# Patient Record
Sex: Female | Born: 1955
Health system: Southern US, Community
[De-identification: ages and names within clinical notes are randomized; demographics above are authoritative.]

## PROBLEM LIST (undated history)

## (undated) DIAGNOSIS — T7840XA Allergy, unspecified, initial encounter: Secondary | ICD-10-CM

## (undated) DIAGNOSIS — B019 Varicella without complication: Secondary | ICD-10-CM

## (undated) DIAGNOSIS — Q249 Congenital malformation of heart, unspecified: Secondary | ICD-10-CM

## (undated) DIAGNOSIS — R030 Elevated blood-pressure reading, without diagnosis of hypertension: Secondary | ICD-10-CM

## (undated) DIAGNOSIS — I10 Essential (primary) hypertension: Secondary | ICD-10-CM

## (undated) HISTORY — PX: TUBAL LIGATION: SHX77

## (undated) HISTORY — DX: Congenital malformation of heart, unspecified: Q24.9

## (undated) HISTORY — DX: Allergy, unspecified, initial encounter: T78.40XA

## (undated) HISTORY — DX: Elevated blood-pressure reading, without diagnosis of hypertension: R03.0

## (undated) HISTORY — DX: Essential (primary) hypertension: I10

## (undated) HISTORY — DX: Varicella without complication: B01.9

---

## 2004-08-15 ENCOUNTER — Ambulatory Visit: Payer: Self-pay | Admitting: Unknown Physician Specialty

## 2005-11-05 ENCOUNTER — Ambulatory Visit: Payer: Self-pay | Admitting: Unknown Physician Specialty

## 2006-05-07 ENCOUNTER — Other Ambulatory Visit: Payer: Self-pay

## 2006-05-07 ENCOUNTER — Emergency Department: Payer: Self-pay | Admitting: Emergency Medicine

## 2006-05-10 ENCOUNTER — Ambulatory Visit: Payer: Self-pay | Admitting: Cardiology

## 2007-07-25 ENCOUNTER — Other Ambulatory Visit: Payer: Self-pay

## 2007-07-25 ENCOUNTER — Observation Stay: Payer: Self-pay | Admitting: Cardiology

## 2007-11-12 ENCOUNTER — Ambulatory Visit: Payer: Self-pay | Admitting: Internal Medicine

## 2008-02-16 ENCOUNTER — Ambulatory Visit: Payer: Self-pay | Admitting: Unknown Physician Specialty

## 2009-03-28 ENCOUNTER — Ambulatory Visit: Payer: Self-pay | Admitting: Unknown Physician Specialty

## 2010-05-27 LAB — HM PAP SMEAR: HM Pap smear: NORMAL

## 2010-07-24 ENCOUNTER — Ambulatory Visit: Payer: Self-pay | Admitting: Unknown Physician Specialty

## 2011-05-28 LAB — HM MAMMOGRAPHY

## 2012-05-27 ENCOUNTER — Encounter: Payer: Self-pay | Admitting: Internal Medicine

## 2012-05-27 ENCOUNTER — Telehealth: Payer: Self-pay | Admitting: Internal Medicine

## 2012-05-27 ENCOUNTER — Ambulatory Visit (INDEPENDENT_AMBULATORY_CARE_PROVIDER_SITE_OTHER): Payer: PRIVATE HEALTH INSURANCE | Admitting: Internal Medicine

## 2012-05-27 VITALS — BP 112/68 | HR 74 | Temp 98.2°F | Ht 62.5 in | Wt 166.8 lb

## 2012-05-27 DIAGNOSIS — I4891 Unspecified atrial fibrillation: Secondary | ICD-10-CM | POA: Insufficient documentation

## 2012-05-27 DIAGNOSIS — Z1211 Encounter for screening for malignant neoplasm of colon: Secondary | ICD-10-CM

## 2012-05-27 DIAGNOSIS — N3941 Urge incontinence: Secondary | ICD-10-CM

## 2012-05-27 DIAGNOSIS — Z1239 Encounter for other screening for malignant neoplasm of breast: Secondary | ICD-10-CM

## 2012-05-27 DIAGNOSIS — I498 Other specified cardiac arrhythmias: Secondary | ICD-10-CM | POA: Insufficient documentation

## 2012-05-27 DIAGNOSIS — I499 Cardiac arrhythmia, unspecified: Secondary | ICD-10-CM

## 2012-05-27 DIAGNOSIS — Z5320 Procedure and treatment not carried out because of patient's decision for unspecified reasons: Secondary | ICD-10-CM

## 2012-05-27 MED ORDER — DILTIAZEM HCL ER 120 MG PO CP24: 120.0000 mg | ORAL_CAPSULE | Freq: Every day | ORAL | Status: DC

## 2012-05-27 MED ORDER — METOPROLOL SUCCINATE ER 50 MG PO TB24: 50.0000 mg | ORAL_TABLET | Freq: Every day | ORAL | Status: DC

## 2012-05-27 NOTE — Assessment & Plan Note (Addendum)
She has a history of PAF per patient  now controlled with daily metoprolol and diltiazem.  Previous eval by Mariel Kansky and ER.  Records requested.  Not anticoagulated.

## 2012-05-27 NOTE — Progress Notes (Signed)
Patient ID: [name], female   DOB: [birth year], 56 y.o.   MRN: [id number]  Patient Active Problem List  Diagnosis  . Arrhythmia, atrial  . Menopause present, declines hormone replacement therapy  . Urge incontinence    Subjective:  CC:   Chief Complaint  Patient presents with  . Establish Care    HPI:   Anne Ingram is a 56 y.o. female who presents as a new patient to establish primary care with the chief complaint of   Past Medical History  Diagnosis Date  . Chicken pox   . Cardiac arrhythmia due to congenital heart disease   . Elevated blood pressure reading   . Hypertension     History reviewed. No pertinent past surgical history.  Family History  Problem Relation Age of Onset  . Hypertension Mother   . Mental illness Mother   . Cancer Neg Hx     History   Social History  . Marital Status: Married    Spouse Name: N/A    Number of Children: N/A  . Years of Education: N/A   Occupational History  . Not on file.   Social History Main Topics  . Smoking status: Former Games developer  . Smokeless tobacco: Not on file  . Alcohol Use: 2.5 oz/week    5 drink(s) per week     rarely  . Drug Use: No  . Sexually Active: Not on file   Other Topics Concern  . Not on file   Social History Narrative  . No narrative on file   Allergies  Allergen Reactions  . Penicillins    Review of Systems:   The remainder of the review of systems was negative except those addressed in the HPI.    Objective:  BP 112/68  Pulse 74  Temp 98.2 F (36.8 C) (Oral)  Ht 5' 2.5" (1.588 m)  Wt 166 lb 12 oz (75.637 kg)  BMI 30.01 kg/m2  SpO2 95%  General appearance: alert, cooperative and appears stated age Ears: normal TM's and external ear canals both ears Throat: lips, mucosa, and tongue normal; teeth and gums normal Neck: no adenopathy, no carotid bruit, supple, symmetrical, trachea midline and thyroid not enlarged, symmetric, no tenderness/mass/nodules Back: symmetric, no  curvature. ROM normal. No CVA tenderness. Lungs: clear to auscultation bilaterally Heart: regular rate and rhythm, S1, S2 normal, no murmur, click, rub or gallop Abdomen: soft, non-tender; bowel sounds normal; no masses,  no organomegaly Pulses: 2+ and symmetric Skin: Skin color, texture, turgor normal. No rashes or lesions Lymph nodes: Cervical, supraclavicular, and axillary nodes normal.  Assessment and Plan:  Arrhythmia, atrial She has a history of PAF per patient  now controlled with daily metoprolol and diltiazem.  Previous eval by Mariel Kansky and ER.  Records requested.  Not anticoagulated.   Menopause present, declines hormone replacement therapy Her symptoms are mild and limited to  Hot flashes.   Urge incontinence Symptoms are mild and infrequent,  Kegel exercises reviewed and recommended daily.   Screening for colon cancer No FH,  Has deferred screenign thus far.  Referral to [facility] discussed and accepted.    Updated Medication List Outpatient Encounter Prescriptions as of 05/27/2012  Medication Sig Dispense Refill  . aspirin 81 MG tablet Take 81 mg by mouth daily.      Marland Kitchen diltiazem (DILACOR XR) 120 MG 24 hr capsule Take 1 capsule (120 mg total) by mouth daily.  90 capsule  3  . Glucosamine-Chondroitin (OSTEO BI-FLEX REGULAR STRENGTH) 250-200  MG TABS Take by mouth.      . metoprolol succinate (TOPROL-XL) 50 MG 24 hr tablet Take 1 tablet (50 mg total) by mouth daily. Take with or immediately following a meal.  90 tablet  3  . Nutritional Supplements (ESTROVEN PO) Take by mouth.      . ONE DAILY MULTIPLE VITAMIN PO Take by mouth.      . DISCONTD: diltiazem (DILACOR XR) 120 MG 24 hr capsule Take 120 mg by mouth daily.      Marland Kitchen DISCONTD: metoprolol succinate (TOPROL-XL) 50 MG 24 hr tablet Take 50 mg by mouth daily. Take with or immediately following a meal.

## 2012-05-27 NOTE — Telephone Encounter (Signed)
Pt called left message wanting to know if she was to have a paper rx or was her rx sent to her pharmacy Please advise pt

## 2012-05-27 NOTE — Telephone Encounter (Signed)
Patient has already picked up RX

## 2012-05-28 ENCOUNTER — Encounter: Payer: Self-pay | Admitting: Internal Medicine

## 2012-05-28 DIAGNOSIS — Z780 Asymptomatic menopausal state: Secondary | ICD-10-CM | POA: Insufficient documentation

## 2012-05-28 DIAGNOSIS — Z5320 Procedure and treatment not carried out because of patient's decision for unspecified reasons: Secondary | ICD-10-CM | POA: Insufficient documentation

## 2012-05-28 DIAGNOSIS — N3941 Urge incontinence: Secondary | ICD-10-CM | POA: Insufficient documentation

## 2012-05-28 DIAGNOSIS — Z1211 Encounter for screening for malignant neoplasm of colon: Secondary | ICD-10-CM | POA: Insufficient documentation

## 2012-05-28 NOTE — Assessment & Plan Note (Signed)
Symptoms are mild and infrequent,  Kegel exercises reviewed and recommended daily.

## 2012-05-28 NOTE — Assessment & Plan Note (Signed)
Her symptoms are mild and limited to  Hot flashes.

## 2012-05-28 NOTE — Assessment & Plan Note (Signed)
No FH,  Has deferred screenign thus far.  Referral to [facility] discussed and accepted.

## 2012-06-02 ENCOUNTER — Ambulatory Visit: Payer: Self-pay | Admitting: Internal Medicine

## 2012-06-02 LAB — BASIC METABOLIC PANEL
BUN: 13 mg/dL (ref 4–21)
Creatinine: 0.8 mg/dL (ref 0.5–1.1)
Glucose: 109 mg/dL
Potassium: 4 mmol/L (ref 3.4–5.3)
Sodium: 139 mmol/L (ref 137–147)

## 2012-06-02 LAB — COMPREHENSIVE METABOLIC PANEL
Albumin: 3.6 g/dL (ref 3.4–5.0)
Alkaline Phosphatase: 82 U/L (ref 50–136)
Anion Gap: 9 (ref 7–16)
BUN: 13 mg/dL (ref 7–18)
Bilirubin,Total: 0.3 mg/dL (ref 0.2–1.0)
Calcium, Total: 8.9 mg/dL (ref 8.5–10.1)
Chloride: 104 mmol/L (ref 98–107)
Co2: 26 mmol/L (ref 21–32)
Creatinine: 0.78 mg/dL (ref 0.60–1.30)
EGFR (African American): >60
EGFR (Non-African Amer.): >60
Glucose: 109 mg/dL — ABNORMAL HIGH (ref 65–99)
Osmolality: 278 (ref 275–301)
Potassium: 4 mmol/L (ref 3.5–5.1)
SGOT(AST): 32 U/L (ref 15–37)
SGPT (ALT): 47 U/L (ref 12–78)
Sodium: 139 mmol/L (ref 136–145)
Total Protein: 7.4 g/dL (ref 6.4–8.2)

## 2012-06-02 LAB — TSH
TSH: 1.86 u[IU]/mL (ref 0.41–5.90)
Thyroid Stimulating Horm: 1.86 u[IU]/mL

## 2012-06-02 LAB — LIPID PANEL
Cholesterol: 191 mg/dL (ref 0–200)
Cholesterol: 191 mg/dL (ref 0–200)
HDL Cholesterol: 41 mg/dL (ref 40–60)
HDL: 41 mg/dL (ref 35–70)
LDL Cholesterol: 117 mg/dL
Ldl Cholesterol, Calc: 117 mg/dL — ABNORMAL HIGH (ref 0–100)
Triglycerides: 165 mg/dL (ref 0–200)
Triglycerides: 165 mg/dL — AB (ref 40–160)
VLDL Cholesterol, Calc: 33 mg/dL (ref 5–40)

## 2012-06-07 ENCOUNTER — Other Ambulatory Visit (HOSPITAL_COMMUNITY)
Admission: RE | Admit: 2012-06-07 | Discharge: 2012-06-07 | Disposition: A | Discharge status: Home / Self Care | Payer: PRIVATE HEALTH INSURANCE | Source: Ambulatory Visit | Attending: Internal Medicine | Admitting: Internal Medicine

## 2012-06-07 ENCOUNTER — Ambulatory Visit (INDEPENDENT_AMBULATORY_CARE_PROVIDER_SITE_OTHER): Payer: PRIVATE HEALTH INSURANCE | Admitting: Internal Medicine

## 2012-06-07 ENCOUNTER — Encounter: Payer: Self-pay | Admitting: Internal Medicine

## 2012-06-07 VITALS — BP 120/68 | HR 74 | Temp 98.1°F | Ht 62.5 in | Wt 165.5 lb

## 2012-06-07 DIAGNOSIS — Z0001 Encounter for general adult medical examination with abnormal findings: Secondary | ICD-10-CM | POA: Insufficient documentation

## 2012-06-07 DIAGNOSIS — Z0000 Encounter for general adult medical examination without abnormal findings: Secondary | ICD-10-CM | POA: Insufficient documentation

## 2012-06-07 DIAGNOSIS — Z01419 Encounter for gynecological examination (general) (routine) without abnormal findings: Secondary | ICD-10-CM | POA: Insufficient documentation

## 2012-06-07 NOTE — Progress Notes (Signed)
Patient ID: [name], female   DOB: 09/30/1955, 56 y.o.   MRN: [id number] Subjective:     Anne Ingram is a 56 y.o. female and is here for a comprehensive physical exam. The patient reports no problems.  History   Social History  . Marital Status: Married    Spouse Name: N/A    Number of Children: N/A  . Years of Education: N/A   Occupational History  . Not on file.   Social History Main Topics  . Smoking status: Former Games developer  . Smokeless tobacco: Not on file  . Alcohol Use: 2.5 oz/week    5 drink(s) per week     rarely  . Drug Use: No  . Sexually Active: Not on file   Other Topics Concern  . Not on file   Social History Narrative  . No narrative on file   Health Maintenance  Topic Date Due  . Colonoscopy  08/30/2006  . Influenza Vaccine  04/24/2012  . Mammogram  05/27/2013  . Pap Smear  05/27/2013  . Tetanus/tdap  11/25/2021    The following portions of the patient's history were reviewed and updated as appropriate: allergies, current medications, past family history, past medical history, past social history, past surgical history and problem list.  Review of Systems A comprehensive review of systems was negative.   Objective:     BP 120/68  Pulse 74  Temp 98.1 F (36.7 C) (Oral)  Ht 5' 2.5" (1.588 m)  Wt 165 lb 8 oz (75.07 kg)  BMI 29.79 kg/m2  SpO2 96%  General Appearance:    Alert, cooperative, no distress, appears stated age  Head:    Normocephalic, without obvious abnormality, atraumatic  Eyes:    PERRL, conjunctiva/corneas clear, EOM's intact, fundi    benign, both eyes  Ears:    Normal TM's and external ear canals, both ears  Nose:   Nares normal, septum midline, mucosa normal, no drainage    or sinus tenderness  Throat:   Lips, mucosa, and tongue normal; teeth and gums normal  Neck:   Supple, symmetrical, trachea midline, no adenopathy;    thyroid:  no enlargement/tenderness/nodules; no carotid   bruit or JVD  Back:     Symmetric, no curvature,  ROM normal, no CVA tenderness  Lungs:     Clear to auscultation bilaterally, respirations unlabored  Chest Wall:    No tenderness or deformity   Heart:    Regular rate and rhythm, S1 and S2 normal, no murmur, rub   or gallop  Breast Exam:    No tenderness, masses, or nipple abnormality  Abdomen:     Soft, non-tender, bowel sounds active all four quadrants,    no masses, no organomegaly  Genitalia:    Normal female without lesion, discharge or tenderness  Rectal:    Normal tone, normal prostate, no masses or tenderness;   guaiac negative stool  Extremities:   Extremities normal, atraumatic, no cyanosis or edema  Pulses:   2+ and symmetric all extremities  Skin:   Skin color, texture, turgor normal, no rashes or lesions  Lymph nodes:   Cervical, supraclavicular, and axillary nodes normal  Neurologic:   CNII-XII intact, normal strength, sensation and reflexes    throughout     Assessment:    Healthy female exam.   Plan:     See After Visit Summary for Counseling Recommendations

## 2012-06-08 NOTE — Assessment & Plan Note (Signed)
PAP pelvic and breast exam were performed today and exam was normal .  Labs were done last week at [facility] bt not received yet.  Colonoscopy referral wasdone last week

## 2012-06-27 ENCOUNTER — Telehealth: Payer: Self-pay | Admitting: Internal Medicine

## 2012-06-27 NOTE — Telephone Encounter (Signed)
Pt called checking on lab results  [facility] will fax results today Please advise pt

## 2012-06-28 ENCOUNTER — Telehealth: Payer: Self-pay | Admitting: Internal Medicine

## 2012-06-28 NOTE — Telephone Encounter (Signed)
Pt just received her results about her sugar and was wanting to know if she could speak with someone again with more in depth about what was going on. She ask to Speak with Dr. Darrick Huntsman is possible. I told her she may need to make an appointment.

## 2012-06-28 NOTE — Telephone Encounter (Signed)
Cholesterol and thyroid fine,  Fasting glucose is abnormal but not diagnostic of diabetes.  Recommend low glycemic index diet and regulary daily exercise to with goal wt reduction of 10% (which is 16 lbs for her) to prevent development of type 2 diabetes.

## 2012-06-30 NOTE — Telephone Encounter (Signed)
Pt called back left message wanting to see if she could talk to dr Darrick Huntsman about her lab results

## 2012-07-04 NOTE — Telephone Encounter (Signed)
Anne Ingram,  You do not need to call patient.  I have dicussed with her.   TT

## 2012-07-04 NOTE — Telephone Encounter (Signed)
I spoke with patient by phone Monday evening.  She was told by Asher Muir that she could not have a copy of the diet because "the folder was empty." patient will be dropping by the office to pick up a copy of my diet.

## 2012-07-04 NOTE — Telephone Encounter (Signed)
I am happy to call her back it will have to be after hours because I see patients all day from  8 to 5:00, unless she wants to make an appt .

## 2012-07-04 NOTE — Telephone Encounter (Signed)
Her labs are now temporarily unavailable for review since they have been sent to be scanned. I can call her when they are resulted in her chart.

## 2012-07-06 ENCOUNTER — Encounter: Payer: Self-pay | Admitting: Internal Medicine

## 2012-07-07 ENCOUNTER — Encounter: Payer: Self-pay | Admitting: Internal Medicine

## 2012-07-07 NOTE — Telephone Encounter (Signed)
I spoke with patient she came by to get the diet information.

## 2012-07-25 LAB — HM COLONOSCOPY

## 2012-07-26 ENCOUNTER — Ambulatory Visit: Payer: Self-pay | Admitting: Internal Medicine

## 2012-07-30 LAB — HM MAMMOGRAPHY: HM Mammogram: NORMAL

## 2012-08-12 ENCOUNTER — Ambulatory Visit: Payer: Self-pay | Admitting: Unknown Physician Specialty

## 2012-08-15 LAB — PATHOLOGY REPORT

## 2012-08-16 ENCOUNTER — Encounter: Payer: Self-pay | Admitting: Internal Medicine

## 2012-08-22 LAB — HM COLONOSCOPY

## 2012-09-01 ENCOUNTER — Encounter: Payer: Self-pay | Admitting: Internal Medicine

## 2013-03-13 ENCOUNTER — Other Ambulatory Visit: Payer: Self-pay | Admitting: *Deleted

## 2013-03-13 MED ORDER — METOPROLOL SUCCINATE ER 50 MG PO TB24: 50.0000 mg | ORAL_TABLET | Freq: Every day | ORAL | Status: DC

## 2013-06-26 ENCOUNTER — Other Ambulatory Visit: Payer: Self-pay | Admitting: *Deleted

## 2013-06-26 MED ORDER — DILTIAZEM HCL ER 120 MG PO CP24: 120.0000 mg | ORAL_CAPSULE | Freq: Every day | ORAL | Status: DC

## 2013-06-30 ENCOUNTER — Other Ambulatory Visit: Payer: Self-pay | Admitting: *Deleted

## 2013-07-31 ENCOUNTER — Telehealth: Payer: Self-pay | Admitting: *Deleted

## 2013-07-31 MED ORDER — DILTIAZEM HCL ER 120 MG PO CP24: 120.0000 mg | ORAL_CAPSULE | Freq: Every day | ORAL | Status: DC

## 2013-07-31 NOTE — Telephone Encounter (Signed)
Patient must keep appointment for further refills. Refill sent per Dr. Darrick Huntsman verbal order.

## 2013-08-29 ENCOUNTER — Encounter: Payer: Self-pay | Admitting: Internal Medicine

## 2013-08-29 ENCOUNTER — Ambulatory Visit (INDEPENDENT_AMBULATORY_CARE_PROVIDER_SITE_OTHER): Payer: 59 | Admitting: Internal Medicine

## 2013-08-29 ENCOUNTER — Encounter (INDEPENDENT_AMBULATORY_CARE_PROVIDER_SITE_OTHER): Payer: Self-pay

## 2013-08-29 VITALS — BP 120/76 | HR 76 | Temp 98.5°F | Resp 14 | Ht 62.25 in | Wt 168.5 lb

## 2013-08-29 DIAGNOSIS — R635 Abnormal weight gain: Secondary | ICD-10-CM

## 2013-08-29 DIAGNOSIS — Z1239 Encounter for other screening for malignant neoplasm of breast: Secondary | ICD-10-CM

## 2013-08-29 DIAGNOSIS — Z0000 Encounter for general adult medical examination without abnormal findings: Secondary | ICD-10-CM

## 2013-08-29 DIAGNOSIS — I498 Other specified cardiac arrhythmias: Secondary | ICD-10-CM

## 2013-08-29 DIAGNOSIS — Z1211 Encounter for screening for malignant neoplasm of colon: Secondary | ICD-10-CM

## 2013-08-29 DIAGNOSIS — I499 Cardiac arrhythmia, unspecified: Secondary | ICD-10-CM

## 2013-08-29 MED ORDER — CIPROFLOXACIN HCL 250 MG PO TABS: 250.0000 mg | ORAL_TABLET | Freq: Two times a day (BID) | ORAL | Status: DC

## 2013-08-29 MED ORDER — SCOPOLAMINE 1 MG/3DAYS TD PT72: 1.0000 | MEDICATED_PATCH | TRANSDERMAL | Status: DC

## 2013-08-29 MED ORDER — LEVOFLOXACIN 500 MG PO TABS: 500.0000 mg | ORAL_TABLET | Freq: Every day | ORAL | Status: DC

## 2013-08-29 NOTE — Progress Notes (Signed)
Pre-visit discussion using our clinic review tool. No additional management support is needed unless otherwise documented below in the visit note.  

## 2013-08-29 NOTE — Patient Instructions (Addendum)
You have a viral  Syndrome .  The post nasal drip is causing your sore throat.  Lavage your sinuses twice daly with Simply saline nasal spray.  Use benadryl 25 mg every 8 hours for congestion and Use Afrin nasal spray but not more than 5 days max   Gargle with salt water often for the sore throat.  If the throat is no better  In 3 to 4 days OR  if you develop T > 100.4,  Green nasal discharge,  Or facial pain,  Start the levaquin    use the cipro for UTI or diarrhea.   return on Wed for fasting labs  When you return from trip, we will taper off of diltiazem

## 2013-08-29 NOTE — Assessment & Plan Note (Addendum)
Managed with daily metoprolol and diltiazem ,  Only one run recently .  Discussed tapering off of dilatiazem when she returns from trip.  Will continue metoprolol

## 2013-08-30 ENCOUNTER — Telehealth: Payer: Self-pay | Admitting: Internal Medicine

## 2013-08-30 ENCOUNTER — Other Ambulatory Visit: Payer: Self-pay | Admitting: *Deleted

## 2013-08-30 DIAGNOSIS — R635 Abnormal weight gain: Secondary | ICD-10-CM | POA: Insufficient documentation

## 2013-08-30 MED ORDER — DILTIAZEM HCL ER 120 MG PO CP24: 120.0000 mg | ORAL_CAPSULE | Freq: Every day | ORAL | Status: DC

## 2013-08-30 NOTE — Assessment & Plan Note (Signed)
Annual comprehensive exam was done including breast, excluding pelvic and PAP smear. All screenings have been addressed .  

## 2013-08-30 NOTE — Telephone Encounter (Signed)
Medication sent patient notified. 

## 2013-08-30 NOTE — Progress Notes (Signed)
Patient ID: Anne Ingram, female   DOB: [birth year], 57 y.o.   MRN: [id number]  Subjective:     Anne Ingram is a 58 y.o. female and is here for a comprehensive physical exam. The patient reports no problems except weight gain.  She [place] next [place] and requesting scopalamine patch  History   Social History  . Marital Status: Married    Spouse Name: N/A    Number of Children: N/A  . Years of Education: N/A   Occupational History  . Not on file.   Social History Main Topics  . Smoking status: Former Research scientist (life sciences)  . Smokeless tobacco: Not on file  . Alcohol Use: 2.5 oz/week    5 drink(s) per week     Comment: rarely  . Drug Use: No  . Sexual Activity: Not on file   Other Topics Concern  . Not on file   Social History Narrative  . No narrative on file   Health Maintenance  Topic Date Due  . Influenza Vaccine  03/24/2013  . Mammogram  07/26/2014  . Pap Smear  06/08/2015  . Tetanus/tdap  11/25/2021  . Colonoscopy  08/12/2022    The following portions of the patient's history were reviewed and updated as appropriate: allergies, current medications, past family history, past medical history, past social history, past surgical history and problem list.  Review of Systems A comprehensive review of systems was negative.   Objective:   BP 120/76  Pulse 76  Temp(Src) 98.5 F (36.9 C) (Oral)  Resp 14  Ht 5' 2.25" (1.581 m)  Wt 168 lb 8 oz (76.431 kg)  BMI 30.58 kg/m2  SpO2 98%  General Appearance:    Alert, cooperative, no distress, appears stated age  Head:    Normocephalic, without obvious abnormality, atraumatic  Eyes:    PERRL, conjunctiva/corneas clear, EOM's intact, fundi    benign, both eyes  Ears:    Normal TM's and external ear canals, both ears  Nose:   Nares normal, septum midline, mucosa normal, no drainage    or sinus tenderness  Throat:   Lips, mucosa, and tongue normal; teeth and gums normal  Neck:   Supple,  symmetrical, trachea midline, no adenopathy;    thyroid:  no enlargement/tenderness/nodules; no carotid   bruit or JVD  Back:     Symmetric, no curvature, ROM normal, no CVA tenderness  Lungs:     Clear to auscultation bilaterally, respirations unlabored  Chest Wall:    No tenderness or deformity   Heart:    Regular rate and rhythm, S1 and S2 normal, no murmur, rub   or gallop  Breast Exam:    No tenderness, masses, or nipple abnormality  Abdomen:     Soft, non-tender, bowel sounds active all four quadrants,    no masses, no organomegaly     Extremities:   Extremities normal, atraumatic, no cyanosis or edema  Pulses:   2+ and symmetric all extremities  Skin:   Skin color, texture, turgor normal, no rashes or lesions  Lymph nodes:   Cervical, supraclavicular, and axillary nodes normal  Neurologic:   CNII-XII intact, normal strength, sensation and reflexes    throughout    .    Assessment and Plan:   Arrhythmia, atrial Managed with daily metoprolol and diltiazem ,  Only one run recently .  Discussed tapering off of dilatiazem when she returns from trip.  Will continue metoprolol  Routine general  medical examination at a health care facility Annual comprehensive exam was done including breast, excluding  pelvic and PAP smear. All screenings have been addressed .   Screening for colon cancer Follow up in 2023 for next colonoscopy per Anne Ingram  Weight gain I have addressed  BMI and recommended a low glycemic index diet utilizing smaller more frequent meals to increase metabolism.  I have also recommended that patient start exercising with a goal of 30 minutes of aerobic exercise a minimum of 5 days per week. Screening for lipid disorders, thyroid and diabetes to be done this week    Updated Medication List Outpatient Encounter Prescriptions as of 08/29/2013  Medication Sig  . diltiazem (DILACOR XR) 120 MG 24 hr capsule Take 1 capsule (120 mg total) by mouth daily.  . metoprolol  succinate (TOPROL-XL) 50 MG 24 hr tablet Take 1 tablet (50 mg total) by mouth daily. Take with or immediately following a meal.  . aspirin 81 MG tablet Take 81 mg by mouth daily.  . ciprofloxacin (CIPRO) 250 MG tablet Take 1 tablet (250 mg total) by mouth 2 (two) times daily. For diarrhea or UTI  . Glucosamine-Chondroitin (OSTEO BI-FLEX REGULAR STRENGTH) 250-200 MG TABS Take by mouth.  . levofloxacin (LEVAQUIN) 500 MG tablet Take 1 tablet (500 mg total) by mouth daily. For sinusitis  . Nutritional Supplements (ESTROVEN PO) Take by mouth.  . ONE DAILY MULTIPLE VITAMIN PO Take by mouth.  Marland Kitchen scopolamine (TRANSDERM-SCOP) 1.5 MG Place 1 patch (1.5 mg total) onto the skin every 3 (three) days.

## 2013-08-30 NOTE — Assessment & Plan Note (Signed)
Follow up in 2023 for next colonoscopy per Vira Agar

## 2013-08-30 NOTE — Telephone Encounter (Signed)
Pt came in to the office needed to get her refill on diltiazem [facility]   She stated she is leaving the country tomorrow and needs this before she leave [facility] [employer]  Please advise pt this has been called in

## 2013-08-30 NOTE — Assessment & Plan Note (Signed)
I have addressed  BMI and recommended a low glycemic index diet utilizing smaller more frequent meals to increase metabolism.  I have also recommended that patient start exercising with a goal of 30 minutes of aerobic exercise a minimum of 5 days per week. Screening for lipid disorders, thyroid and diabetes to be done this week

## 2013-08-30 NOTE — Telephone Encounter (Signed)
Pt calling again to check status of refill on diltiazem.

## 2013-09-12 ENCOUNTER — Telehealth: Payer: Self-pay | Admitting: *Deleted

## 2013-09-12 DIAGNOSIS — E785 Hyperlipidemia, unspecified: Secondary | ICD-10-CM

## 2013-09-12 DIAGNOSIS — R5381 Other malaise: Secondary | ICD-10-CM

## 2013-09-12 DIAGNOSIS — E559 Vitamin D deficiency, unspecified: Secondary | ICD-10-CM

## 2013-09-12 DIAGNOSIS — R5383 Other fatigue: Secondary | ICD-10-CM

## 2013-09-12 NOTE — Telephone Encounter (Signed)
Pt is coming in for labs tomorrow 01.21.2015 what labs and dx?  

## 2013-09-13 ENCOUNTER — Other Ambulatory Visit (INDEPENDENT_AMBULATORY_CARE_PROVIDER_SITE_OTHER): Payer: 59

## 2013-09-13 DIAGNOSIS — R5381 Other malaise: Secondary | ICD-10-CM

## 2013-09-13 DIAGNOSIS — E559 Vitamin D deficiency, unspecified: Secondary | ICD-10-CM

## 2013-09-13 DIAGNOSIS — R5383 Other fatigue: Principal | ICD-10-CM

## 2013-09-13 DIAGNOSIS — E785 Hyperlipidemia, unspecified: Secondary | ICD-10-CM

## 2013-09-13 LAB — LIPID PANEL
Cholesterol: 207 mg/dL — ABNORMAL HIGH (ref 0–200)
HDL: 49.3 mg/dL (ref >39.00)
Total CHOL/HDL Ratio: 4
Triglycerides: 131 mg/dL (ref 0.0–149.0)
VLDL: 26.2 mg/dL (ref 0.0–40.0)

## 2013-09-13 LAB — COMPREHENSIVE METABOLIC PANEL
ALT: 22 U/L (ref 0–35)
AST: 20 U/L (ref 0–37)
Albumin: 4.1 g/dL (ref 3.5–5.2)
Alkaline Phosphatase: 71 U/L (ref 39–117)
BUN: 17 mg/dL (ref 6–23)
CO2: 25 mEq/L (ref 19–32)
Calcium: 9.4 mg/dL (ref 8.4–10.5)
Chloride: 108 mEq/L (ref 96–112)
Creatinine, Ser: 0.8 mg/dL (ref 0.4–1.2)
GFR: 78.57 mL/min (ref >60.00)
Glucose, Bld: 99 mg/dL (ref 70–99)
Potassium: 4.4 mEq/L (ref 3.5–5.1)
Sodium: 140 mEq/L (ref 135–145)
Total Bilirubin: 0.3 mg/dL (ref 0.3–1.2)
Total Protein: 7.5 g/dL (ref 6.0–8.3)

## 2013-09-13 LAB — CBC WITH DIFFERENTIAL/PLATELET
Basophils Absolute: 0 10*3/uL (ref 0.0–0.1)
Basophils Relative: 0.5 % (ref 0.0–3.0)
Eosinophils Absolute: 0.3 10*3/uL (ref 0.0–0.7)
Eosinophils Relative: 4.3 % (ref 0.0–5.0)
HCT: 39.3 % (ref 36.0–46.0)
Hemoglobin: 13.3 g/dL (ref 12.0–15.0)
Lymphocytes Relative: 47.2 % — ABNORMAL HIGH (ref 12.0–46.0)
Lymphs Abs: 3 10*3/uL (ref 0.7–4.0)
MCHC: 33.8 g/dL (ref 30.0–36.0)
MCV: 86.6 fl (ref 78.0–100.0)
Monocytes Absolute: 0.3 10*3/uL (ref 0.1–1.0)
Monocytes Relative: 4.7 % (ref 3.0–12.0)
Neutro Abs: 2.8 10*3/uL (ref 1.4–7.7)
Neutrophils Relative %: 43.3 % (ref 43.0–77.0)
Platelets: 249 10*3/uL (ref 150.0–400.0)
RBC: 4.54 Mil/uL (ref 3.87–5.11)
RDW: 12.4 % (ref 11.5–14.6)
WBC: 6.4 10*3/uL (ref 4.5–10.5)

## 2013-09-13 LAB — TSH: TSH: 1.17 u[IU]/mL (ref 0.35–5.50)

## 2013-09-13 LAB — LDL CHOLESTEROL, DIRECT: Direct LDL: 147.1 mg/dL

## 2013-09-14 LAB — VITAMIN D 25 HYDROXY (VIT D DEFICIENCY, FRACTURES): Vit D, 25-Hydroxy: 55 ng/mL (ref 30–89)

## 2013-09-15 ENCOUNTER — Encounter: Payer: Self-pay | Admitting: *Deleted

## 2013-09-27 ENCOUNTER — Other Ambulatory Visit: Payer: Self-pay | Admitting: *Deleted

## 2013-09-28 ENCOUNTER — Other Ambulatory Visit: Payer: Self-pay | Admitting: *Deleted

## 2013-09-28 ENCOUNTER — Telehealth: Payer: Self-pay | Admitting: *Deleted

## 2013-09-28 MED ORDER — METOPROLOL SUCCINATE ER 50 MG PO TB24
50.0000 mg | ORAL_TABLET | Freq: Every day | ORAL | Status: DC
Start: 2013-09-27 — End: 2014-04-02

## 2013-09-28 NOTE — Telephone Encounter (Signed)
Patient called upset that script had not been called to pharmacy called and verified scripts at pharmacy and notified patient as requested.

## 2014-02-28 ENCOUNTER — Other Ambulatory Visit: Payer: Self-pay | Admitting: Internal Medicine

## 2014-04-02 ENCOUNTER — Other Ambulatory Visit: Payer: Self-pay | Admitting: Internal Medicine

## 2014-05-21 IMAGING — MG MM CAD SCREENING MAMMO
1 series · 5 of 5 positions shown · non-contrast
Comparison: none

REASON FOR EXAM: SCR MAMMO NO ORDER CAT 2
COMMENTS:

PROCEDURE:     MAM - MAM DGTL SCRN MAM NO ORDER W/CAD  - July 26, 2012  [DATE]
RESULT:     COMPARISON:  07/24/2010, 03/28/2009, 11/05/2005
TECHNIQUE: Digital screening mammograms were obtained. FDA approved
computer-aided detection (CAD) for mammography was utilized for this study.

[R CC · right · 5 of 5 slices shown]
[im 1/5]
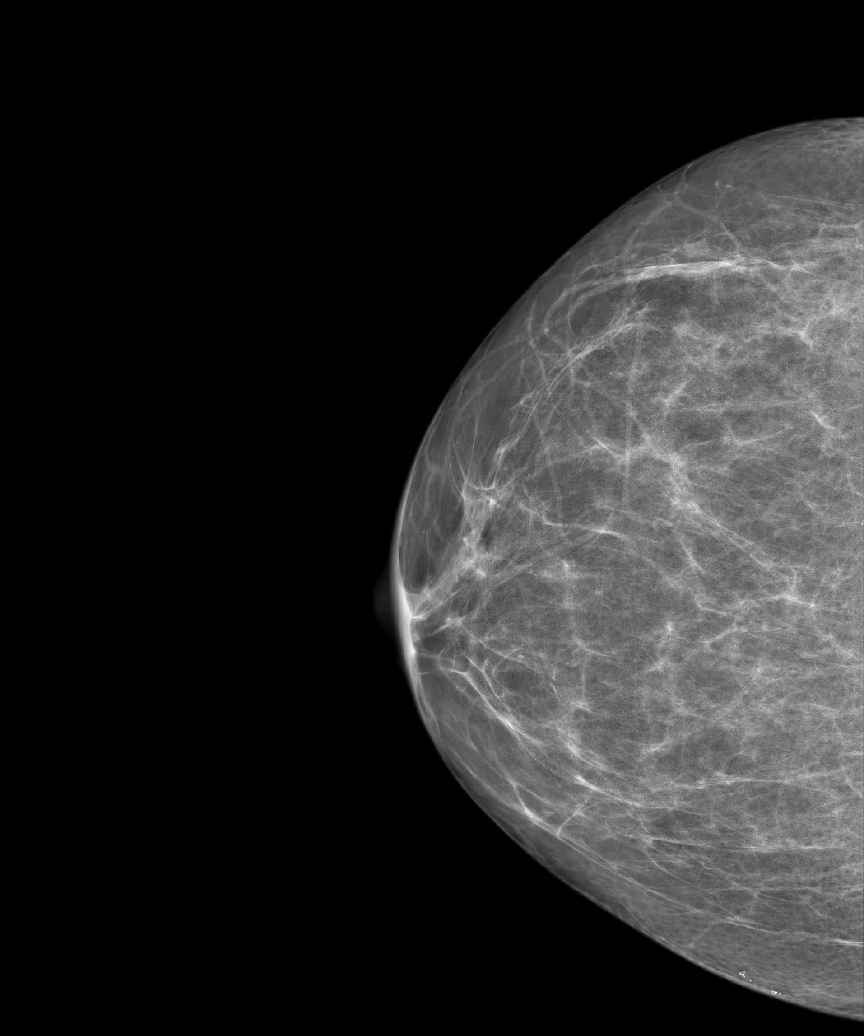
[im 2/5]
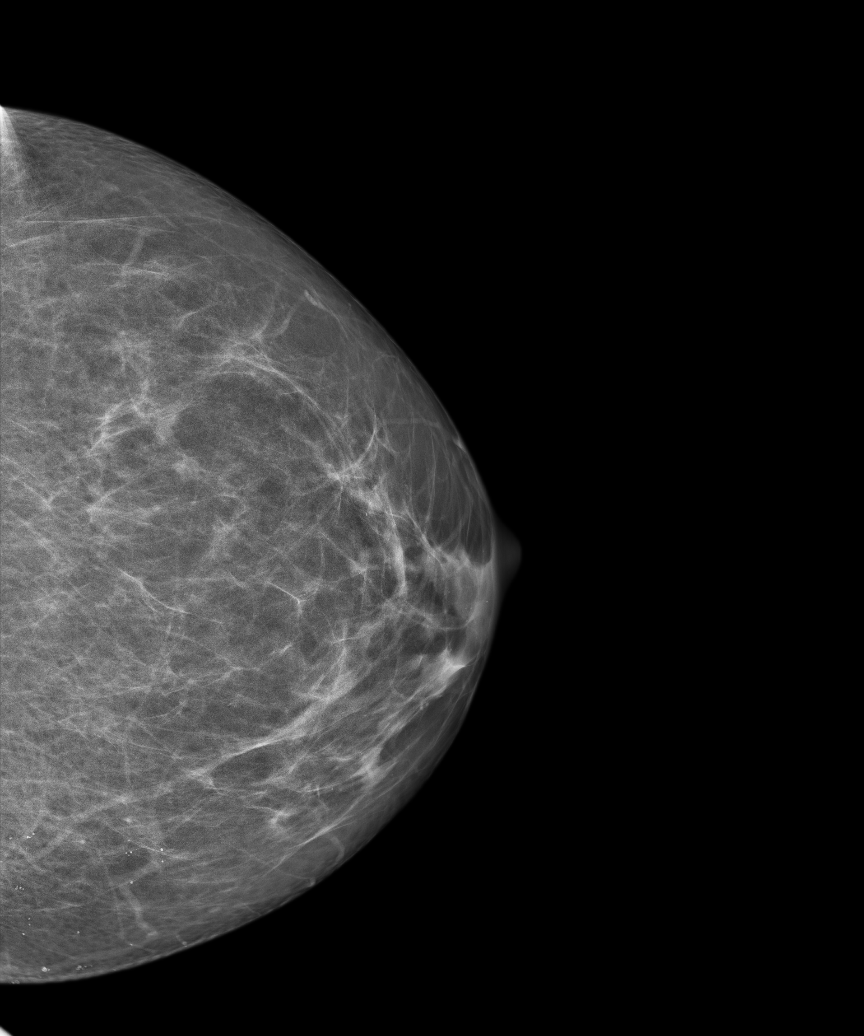
[im 3/5]
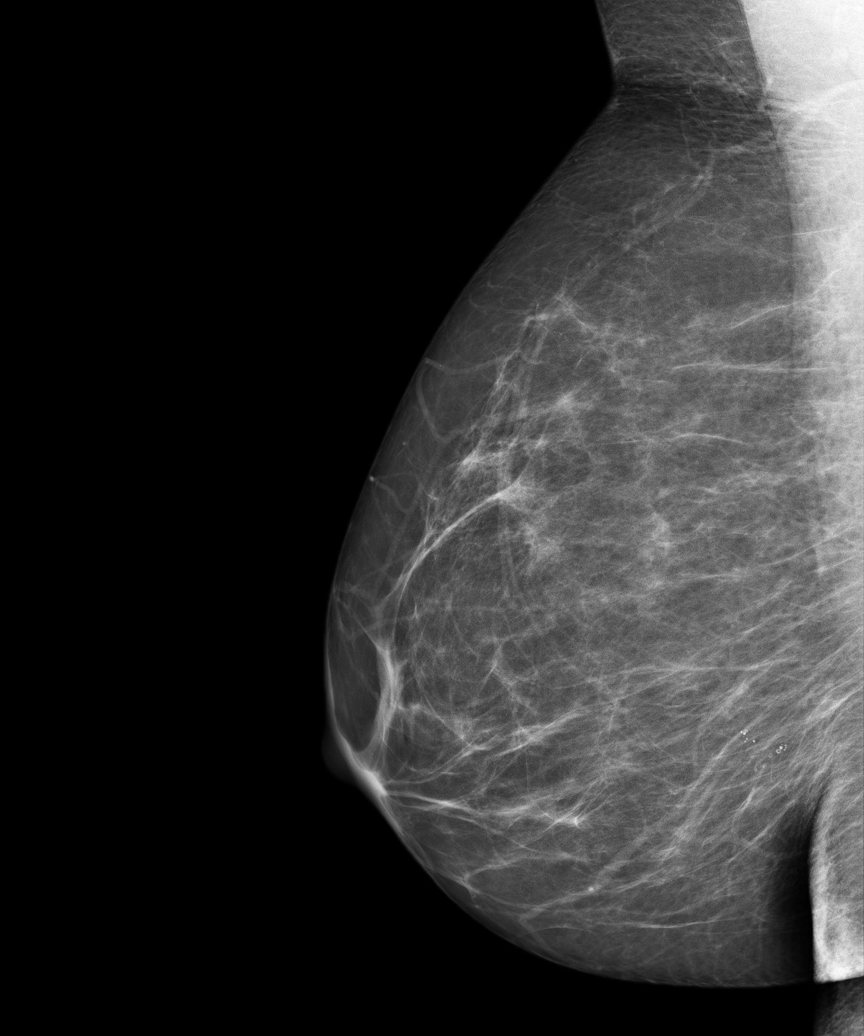
[im 4/5]
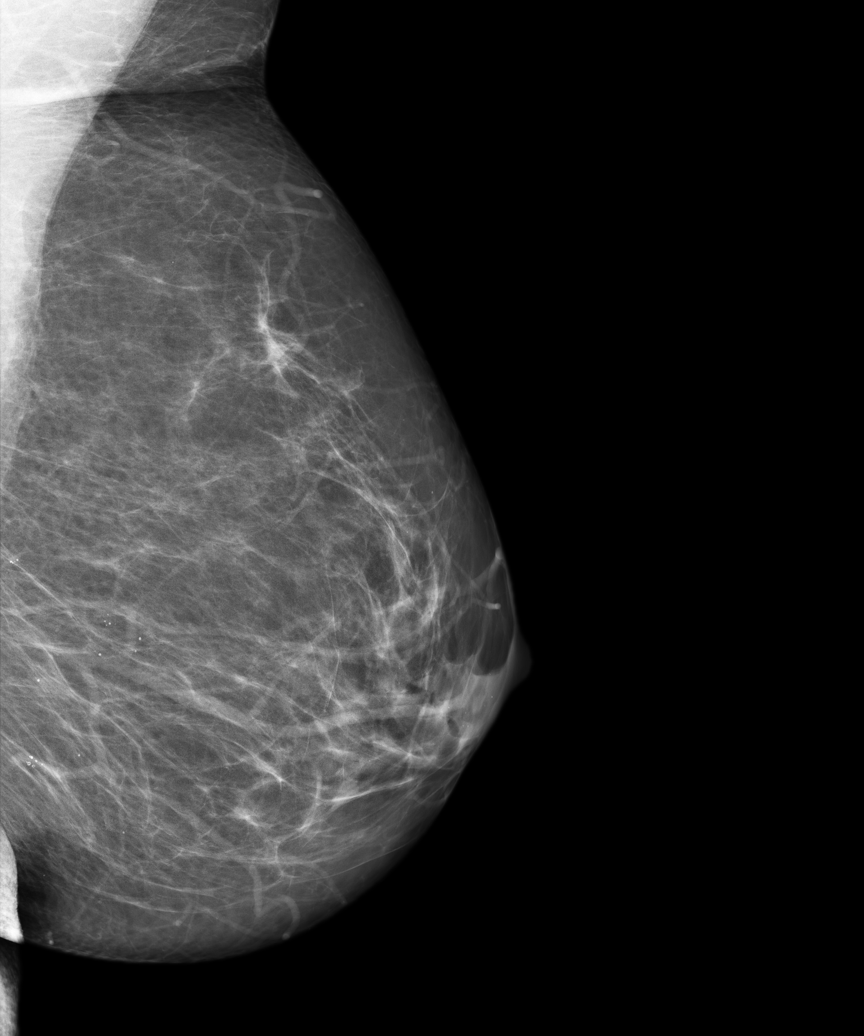
[im 5/5]
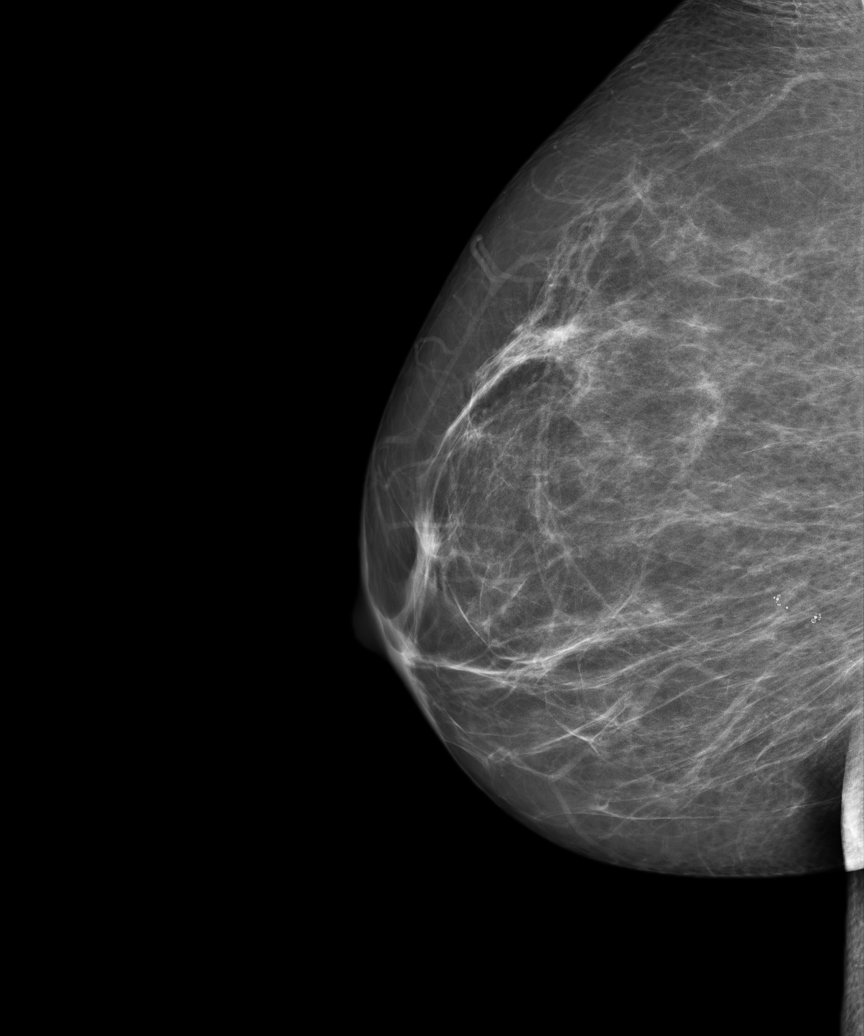

[5 of 5 positions shown; findings below may reference images not displayed]

FINDING: Bilateral breasts demonstrate a scattered fibroglandular density. There is
no dominant mass, architectural distortion or clusters of suspicious
microcalcifications.
IMPRESSION: 1.     Stable bilateral mammogram.
2.     Annual mammographic follow up recommended.
3.     BI-RADS:  Category 2- Benign.

A negative mammogram report does not preclude biopsy or other evaluation of
a clinically palpable or otherwise suspicious mass or lesion. Breast cancer
may not be detected by mammography in up to 10% of cases.

[REDACTED]

## 2014-07-03 ENCOUNTER — Other Ambulatory Visit: Payer: Self-pay | Admitting: *Deleted

## 2014-07-03 ENCOUNTER — Telehealth: Payer: Self-pay

## 2014-07-03 MED ORDER — DILTIAZEM HCL ER COATED BEADS 120 MG PO CP24: ORAL_CAPSULE | ORAL | Status: DC

## 2014-07-03 MED ORDER — METOPROLOL SUCCINATE ER 50 MG PO TB24: ORAL_TABLET | ORAL | Status: DC

## 2014-07-03 NOTE — Telephone Encounter (Signed)
The pt called and is needing refills on Metoprolol and [place] [facility] [phone]

## 2014-08-31 ENCOUNTER — Encounter: Payer: 59 | Admitting: Internal Medicine

## 2014-09-25 ENCOUNTER — Encounter: Payer: Self-pay | Admitting: Internal Medicine

## 2014-09-25 ENCOUNTER — Ambulatory Visit (INDEPENDENT_AMBULATORY_CARE_PROVIDER_SITE_OTHER): Payer: 59 | Admitting: Internal Medicine

## 2014-09-25 VITALS — BP 132/72 | HR 75 | Temp 98.5°F | Resp 16 | Ht 62.5 in | Wt 175.2 lb

## 2014-09-25 DIAGNOSIS — Z0000 Encounter for general adult medical examination without abnormal findings: Secondary | ICD-10-CM

## 2014-09-25 DIAGNOSIS — I499 Cardiac arrhythmia, unspecified: Secondary | ICD-10-CM

## 2014-09-25 DIAGNOSIS — Z1239 Encounter for other screening for malignant neoplasm of breast: Secondary | ICD-10-CM

## 2014-09-25 DIAGNOSIS — Z1159 Encounter for screening for other viral diseases: Secondary | ICD-10-CM

## 2014-09-25 DIAGNOSIS — R635 Abnormal weight gain: Secondary | ICD-10-CM

## 2014-09-25 DIAGNOSIS — I498 Other specified cardiac arrhythmias: Secondary | ICD-10-CM

## 2014-09-25 MED ORDER — METOPROLOL SUCCINATE ER 50 MG PO TB24
ORAL_TABLET | ORAL | Status: DC
Start: 2014-09-25 — End: 2015-06-11

## 2014-09-25 NOTE — Progress Notes (Signed)
Pre-visit discussion using our clinic review tool. No additional management support is needed unless otherwise documented below in the visit note.  

## 2014-09-25 NOTE — Progress Notes (Signed)
Patient ID: Anne Ingram, female   DOB: 1955-09-13, 59 y.o.   MRN: [id number]  Subjective:     Anne Ingram is a 59 y.o. female and is here for a comprehensive physical exam. The patient reports no problems.  History   Social History  . Marital Status: Married    Spouse Name: N/A    Number of Children: N/A  . Years of Education: N/A   Occupational History  . Not on file.   Social History Main Topics  . Smoking status: Former Research scientist (life sciences)  . Smokeless tobacco: Not on file  . Alcohol Use: 2.5 oz/week    5 drink(s) per week     Comment: rarely  . Drug Use: No  . Sexual Activity: Not on file   Other Topics Concern  . Not on file   Social History Narrative   Health Maintenance  Topic Date Due  . INFLUENZA VACCINE  03/24/2014  . MAMMOGRAM  07/30/2014  . PAP SMEAR  06/08/2015  . TETANUS/TDAP  11/25/2021  . COLONOSCOPY  08/22/2022    The following portions of the patient's history were reviewed and updated as appropriate: allergies, current medications, past family history, past medical history, past social history, past surgical history and problem list.  Review of Systems A comprehensive review of systems was negative.   Objective:   BP 132/72 mmHg  Pulse 75  Temp(Src) 98.5 F (36.9 C) (Oral)  Resp 16  Ht 5' 2.5" (1.588 m)  Wt 175 lb 4 oz (79.493 kg)  BMI 31.52 kg/m2  SpO2 95%  General appearance: alert, cooperative and appears stated age Head: Normocephalic, without obvious abnormality, atraumatic Eyes: conjunctivae/corneas clear. PERRL, EOM's intact. Fundi benign. Ears: normal TM's and external ear canals both ears Nose: Nares normal. Septum midline. Mucosa normal. No drainage or sinus tenderness. Throat: lips, mucosa, and tongue normal; teeth and gums normal Neck: no adenopathy, no carotid bruit, no JVD, supple, symmetrical, trachea midline and thyroid not enlarged, symmetric, no tenderness/mass/nodules Lungs: clear to auscultation bilaterally Breasts: normal  appearance, no masses or tenderness Heart: regular rate and rhythm, S1, S2 normal, no murmur, click, rub or gallop Abdomen: soft, non-tender; bowel sounds normal; no masses,  no organomegaly Extremities: extremities normal, atraumatic, no cyanosis or edema Pulses: 2+ and symmetric Skin: Skin color, texture, turgor normal. No rashes or lesions Neurologic: Alert and oriented X 3, normal strength and tone. Normal symmetric reflexes. Normal coordination and gait.    Assessment and Plan:   Problem List Items Addressed This Visit    Arrhythmia, atrial    Stopping diltiazem, continuing metoprolol      Relevant Medications   metoprolol succinate (TOPROL-XL) 24 hr tablet   Routine general medical examination at a health care facility    Annual wellness  exam was done as well as a comprehensive physical exam and management of acute and chronic conditions .  During the course of the visit the patient was educated and counseled about appropriate screening and preventive services including :  diabetes screening, lipid analysis with projected  10 year  risk for CAD , nutrition counseling, colorectal cancer screening, and recommended immunizations.  Printed recommendations for health maintenance screenings was given.   Lab Results  Component Value Date   CHOL 229* 09/25/2014   HDL 54.90 09/25/2014   LDLCALC 117 06/02/2012   LDLDIRECT 138.0 09/25/2014   TRIG 321.0* 09/25/2014   CHOLHDL 4 09/25/2014   .      Weight gain    With  hypertriglyceridemia noted. I have addressed  BMI and recommended wt loss of 10% of body weigh over the next 6 months using a low glycemic index diet and regular exercise a minimum of 5 days per week.        Relevant Orders   Comprehensive metabolic panel (Completed)   TSH (Completed)   Lipid panel (Completed)    Other Visit Diagnoses    Screening for breast cancer    -  Primary    Relevant Orders    MM DIGITAL SCREENING BILATERAL    Need for hepatitis C screening  test        Relevant Orders    Hepatitis C antibody (Completed)

## 2014-09-25 NOTE — Patient Instructions (Addendum)
We are stopping the diltiazem and continuing the metoprolol.  If you start having breakthrough runs of tachycardia.   Health Maintenance Adopting a healthy lifestyle and getting preventive care can go a long way to promote health and wellness. Talk with your health care provider about what schedule of regular examinations is right for you. This is a good chance for you to check in with your provider about disease prevention and staying healthy. In between checkups, there are plenty of things you can do on your own. Experts have done a lot of research about which lifestyle changes and preventive measures are most likely to keep you healthy. Ask your health care provider for more information. WEIGHT AND DIET  Eat a healthy diet  Be sure to include plenty of vegetables, fruits, low-fat dairy products, and lean protein.  Do not eat a lot of foods high in solid fats, added sugars, or salt.  Get regular exercise. This is one of the most important things you can do for your health.  Most adults should exercise for at least 150 minutes each week. The exercise should increase your heart rate and make you sweat (moderate-intensity exercise).  Most adults should also do strengthening exercises at least twice a week. This is in addition to the moderate-intensity exercise.  Maintain a healthy weight  Body mass index (BMI) is a measurement that can be used to identify possible weight problems. It estimates body fat based on height and weight. Your health care provider can help determine your BMI and help you achieve or maintain a healthy weight.  For females 71 years of age and older:   A BMI below 18.5 is considered underweight.  A BMI of 18.5 to 24.9 is normal.  A BMI of 25 to 29.9 is considered overweight.  A BMI of 30 and above is considered obese.  Watch levels of cholesterol and blood lipids  You should start having your blood tested for lipids and cholesterol at 58 years of age, then have  this test every 5 years.  You may need to have your cholesterol levels checked more often if:  Your lipid or cholesterol levels are high.  You are older than 58 years of age.  You are at high risk for heart disease.  CANCER SCREENING   Lung Cancer  Lung cancer screening is recommended for adults 59-27 years old who are at high risk for lung cancer because of a history of smoking.  A yearly low-dose CT scan of the lungs is recommended for people who:  Currently smoke.  Have quit within the past 15 years.  Have at least a 30-pack-year history of smoking. A pack year is smoking an average of one pack of cigarettes a day for 1 year.  Yearly screening should continue until it has been 15 years since you quit.  Yearly screening should stop if you develop a health problem that would prevent you from having lung cancer treatment.  Breast Cancer  Practice breast self-awareness. This means understanding how your breasts normally appear and feel.  It also means doing regular breast self-exams. Let your health care provider know about any changes, no matter how small.  If you are in your 20s or 30s, you should have a clinical breast exam (CBE) by a health care provider every 1-3 years as part of a regular health exam.  If you are 2 or older, have a CBE every year. Also consider having a breast X-ray (mammogram) every year.  If you  have a family history of breast cancer, talk to your health care provider about genetic screening.  If you are at high risk for breast cancer, talk to your health care provider about having an MRI and a mammogram every year.  Breast cancer gene (BRCA) assessment is recommended for women who have family members with BRCA-related cancers. BRCA-related cancers include:  Breast.  Ovarian.  Tubal.  Peritoneal cancers.  Results of the assessment will determine the need for genetic counseling and BRCA1 and BRCA2 testing. Cervical Cancer Routine pelvic  examinations to screen for cervical cancer are no longer recommended for nonpregnant women who are considered low risk for cancer of the pelvic organs (ovaries, uterus, and vagina) and who do not have symptoms. A pelvic examination may be necessary if you have symptoms including those associated with pelvic infections. Ask your health care provider if a screening pelvic exam is right for you.   The Pap test is the screening test for cervical cancer for women who are considered at risk.  If you had a hysterectomy for a problem that was not cancer or a condition that could lead to cancer, then you no longer need Pap tests.  If you are older than 65 years, and you have had normal Pap tests for the past 10 years, you no longer need to have Pap tests.  If you have had past treatment for cervical cancer or a condition that could lead to cancer, you need Pap tests and screening for cancer for at least 20 years after your treatment.  If you no longer get a Pap test, assess your risk factors if they change (such as having a new sexual partner). This can affect whether you should start being screened again.  Some women have medical problems that increase their chance of getting cervical cancer. If this is the case for you, your health care provider may recommend more frequent screening and Pap tests.  The human papillomavirus (HPV) test is another test that may be used for cervical cancer screening. The HPV test looks for the virus that can cause cell changes in the cervix. The cells collected during the Pap test can be tested for HPV.  The HPV test can be used to screen women 71 years of age and older. Getting tested for HPV can extend the interval between normal Pap tests from three to five years.  An HPV test also should be used to screen women of any age who have unclear Pap test results.  After 58 years of age, women should have HPV testing as often as Pap tests.  Colorectal Cancer  This type of  cancer can be detected and often prevented.  Routine colorectal cancer screening usually begins at 58 years of age and continues through 57 years of age.  Your health care provider may recommend screening at an earlier age if you have risk factors for colon cancer.  Your health care provider may also recommend using home test kits to check for hidden blood in the stool.  A small camera at the end of a tube can be used to examine your colon directly (sigmoidoscopy or colonoscopy). This is done to check for the earliest forms of colorectal cancer.  Routine screening usually begins at age 8.  Direct examination of the colon should be repeated every 5-10 years through 58 years of age. However, you may need to be screened more often if early forms of precancerous polyps or small growths are found. Skin Cancer  Check  your skin from head to toe regularly.  Tell your health care provider about any new moles or changes in moles, especially if there is a change in a mole's shape or color.  Also tell your health care provider if you have a mole that is larger than the size of a pencil eraser.  Always use sunscreen. Apply sunscreen liberally and repeatedly throughout the day.  Protect yourself by wearing long sleeves, pants, a wide-brimmed hat, and sunglasses whenever you are outside. HEART DISEASE, DIABETES, AND HIGH BLOOD PRESSURE   Have your blood pressure checked at least every 1-2 years. High blood pressure causes heart disease and increases the risk of stroke.  If you are between 80 years and 22 years old, ask your health care provider if you should take aspirin to prevent strokes.  Have regular diabetes screenings. This involves taking a blood sample to check your fasting blood sugar level.  If you are at a normal weight and have a low risk for diabetes, have this test once every three years after 58 years of age.  If you are overweight and have a high risk for diabetes, consider being  tested at a younger age or more often. PREVENTING INFECTION  Hepatitis B  If you have a higher risk for hepatitis B, you should be screened for this virus. You are considered at high risk for hepatitis B if:  You were born in a country where hepatitis B is common. Ask your health care provider which countries are considered high risk.  Your parents were born in a high-risk country, and you have not been immunized against hepatitis B (hepatitis B vaccine).  You have HIV or AIDS.  You use needles to inject [place] drugs.  You live with someone who has hepatitis B.  You have had sex with someone who has hepatitis B.  You get hemodialysis treatment.  You take certain medicines for conditions, including cancer, organ transplantation, and autoimmune conditions. Hepatitis C  Blood testing is recommended for:  Everyone born from [birth year] through 1964.  Anyone with known risk factors for hepatitis C. Sexually transmitted infections (STIs)  You should be screened for sexually transmitted infections (STIs) including gonorrhea and chlamydia if:  You are sexually active and are younger than 58 years of age.  You are older than 58 years of age and your health care provider tells you that you are at risk for this type of infection.  Your sexual activity has changed since you were last screened and you are at an increased risk for chlamydia or gonorrhea. Ask your health care provider if you are at risk.  If you do not have HIV, but are at risk, it may be recommended that you take a prescription medicine daily to prevent HIV infection. This is called pre-exposure prophylaxis (PrEP). You are considered at risk if:  You are sexually active and do not regularly use condoms or know the HIV status of your partner(s).  You take drugs by injection.  You are sexually active with a partner who has HIV. Talk with your health care provider about whether you are at high risk of being infected with HIV. If  you choose to begin PrEP, you should first be tested for HIV. You should then be tested every 3 months for as long as you are taking PrEP.  PREGNANCY   If you are premenopausal and you may become pregnant, ask your health care provider about preconception counseling.  If you may become pregnant, take  400 to 800 micrograms (mcg) of folic acid every day.  If you want to prevent pregnancy, talk to your health care provider about birth control (contraception). OSTEOPOROSIS AND MENOPAUSE   Osteoporosis is a disease in which the bones lose minerals and strength with aging. This can result in serious bone fractures. Your risk for osteoporosis can be identified using a bone density scan.  If you are 88 years of age or older, or if you are at risk for osteoporosis and fractures, ask your health care provider if you should be screened.  Ask your health care provider whether you should take a calcium or vitamin D supplement to lower your risk for osteoporosis.  Menopause may have certain physical symptoms and risks.  Hormone replacement therapy may reduce some of these symptoms and risks. Talk to your health care provider about whether hormone replacement therapy is right for you.  HOME CARE INSTRUCTIONS   Schedule regular health, dental, and eye exams.  Stay current with your immunizations.   Do not use any tobacco products including cigarettes, chewing tobacco, or electronic cigarettes.  If you are pregnant, do not drink alcohol.  If you are breastfeeding, limit how much and how often you drink alcohol.  Limit alcohol intake to no more than 1 drink per day for nonpregnant women. One drink equals 12 ounces of beer, 5 ounces of wine, or 1 ounces of hard liquor.  Do not use Vanzile drugs.  Do not share needles.  Ask your health care provider for help if you need support or information about quitting drugs.  Tell your health care provider if you often feel depressed.  Tell your health  care provider if you have ever been abused or do not feel safe at home. Document Released: 02/23/2011 Document Revised: 12/25/2013 Document Reviewed: 07/12/2013 [facility] Patient Information 2015 [place], [place]. This information is not intended to replace advice given to you by your health care provider. Make sure you discuss any questions you have with your health care provider.

## 2014-09-26 LAB — COMPREHENSIVE METABOLIC PANEL
ALT: 24 U/L (ref 0–35)
AST: 21 U/L (ref 0–37)
Albumin: 4.4 g/dL (ref 3.5–5.2)
Alkaline Phosphatase: 67 U/L (ref 39–117)
BUN: 21 mg/dL (ref 6–23)
CO2: 27 mEq/L (ref 19–32)
Calcium: 9.5 mg/dL (ref 8.4–10.5)
Chloride: 104 mEq/L (ref 96–112)
Creatinine, Ser: 0.92 mg/dL (ref 0.40–1.20)
GFR: 66.62 mL/min (ref >60.00)
Glucose, Bld: 86 mg/dL (ref 70–99)
Potassium: 4.1 mEq/L (ref 3.5–5.1)
Sodium: 138 mEq/L (ref 135–145)
Total Bilirubin: 0.3 mg/dL (ref 0.2–1.2)
Total Protein: 7.2 g/dL (ref 6.0–8.3)

## 2014-09-26 LAB — LIPID PANEL
Cholesterol: 229 mg/dL — ABNORMAL HIGH (ref 0–200)
HDL: 54.9 mg/dL (ref >39.00)
NonHDL: 174.1
Total CHOL/HDL Ratio: 4
Triglycerides: 321 mg/dL — ABNORMAL HIGH (ref 0.0–149.0)
VLDL: 64.2 mg/dL — ABNORMAL HIGH (ref 0.0–40.0)

## 2014-09-26 LAB — LDL CHOLESTEROL, DIRECT: Direct LDL: 138 mg/dL

## 2014-09-26 LAB — TSH: TSH: 1.48 u[IU]/mL (ref 0.35–4.50)

## 2014-09-26 LAB — HEPATITIS C ANTIBODY: HCV Ab: NEGATIVE

## 2014-09-26 NOTE — Assessment & Plan Note (Signed)
Stopping diltiazem, continuing metoprolol

## 2014-09-26 NOTE — Assessment & Plan Note (Signed)
With hypertriglyceridemia noted. I have addressed  BMI and recommended wt loss of 10% of body weigh over the next 6 months using a low glycemic index diet and regular exercise a minimum of 5 days per week.

## 2014-09-26 NOTE — Assessment & Plan Note (Addendum)
Annual wellness  exam was done as well as a comprehensive physical exam and management of acute and chronic conditions .  During the course of the visit the patient was educated and counseled about appropriate screening and preventive services including :  diabetes screening, lipid analysis with projected  10 year  risk for CAD , nutrition counseling, colorectal cancer screening, and recommended immunizations.  Printed recommendations for health maintenance screenings was given.   Lab Results  Component Value Date   CHOL 229* 09/25/2014   HDL 54.90 09/25/2014   LDLCALC 117 06/02/2012   LDLDIRECT 138.0 09/25/2014   TRIG 321.0* 09/25/2014   CHOLHDL 4 09/25/2014   .

## 2014-10-01 ENCOUNTER — Encounter: Payer: Self-pay | Admitting: *Deleted

## 2014-11-02 ENCOUNTER — Ambulatory Visit: Payer: Self-pay | Admitting: Internal Medicine

## 2014-11-02 LAB — HM MAMMOGRAPHY: HM Mammogram: NEGATIVE

## 2015-06-11 ENCOUNTER — Other Ambulatory Visit: Payer: Self-pay

## 2015-06-11 ENCOUNTER — Telehealth: Payer: Self-pay | Admitting: Internal Medicine

## 2015-06-11 DIAGNOSIS — I498 Other specified cardiac arrhythmias: Secondary | ICD-10-CM

## 2015-06-11 NOTE — Telephone Encounter (Signed)
Please advise Ov was in 2/16?

## 2015-06-11 NOTE — Telephone Encounter (Signed)
Sent message to Dr. Derrel Nip to review, Last OV was 2/16.

## 2015-06-11 NOTE — Telephone Encounter (Signed)
Pt needs refill on Metoprolol 90 day supply. Please advise pt.msn

## 2015-06-12 MED ORDER — METOPROLOL SUCCINATE ER 50 MG PO TB24: ORAL_TABLET | ORAL | Status: DC

## 2015-06-12 NOTE — Telephone Encounter (Signed)
90 day supply authorized and sent.  One year follow up ok for her   .

## 2015-09-03 DIAGNOSIS — L304 Erythema intertrigo: Secondary | ICD-10-CM | POA: Diagnosis not present

## 2015-09-03 DIAGNOSIS — D485 Neoplasm of uncertain behavior of skin: Secondary | ICD-10-CM | POA: Diagnosis not present

## 2015-09-03 DIAGNOSIS — L718 Other rosacea: Secondary | ICD-10-CM | POA: Diagnosis not present

## 2015-09-03 DIAGNOSIS — C44319 Basal cell carcinoma of skin of other parts of face: Secondary | ICD-10-CM | POA: Diagnosis not present

## 2015-09-03 DIAGNOSIS — C44519 Basal cell carcinoma of skin of other part of trunk: Secondary | ICD-10-CM | POA: Diagnosis not present

## 2015-09-17 ENCOUNTER — Other Ambulatory Visit: Payer: Self-pay

## 2015-09-17 ENCOUNTER — Telehealth: Payer: Self-pay | Admitting: Internal Medicine

## 2015-09-17 DIAGNOSIS — I498 Other specified cardiac arrhythmias: Secondary | ICD-10-CM

## 2015-09-17 MED ORDER — METOPROLOL SUCCINATE ER 50 MG PO TB24: ORAL_TABLET | ORAL | Status: DC

## 2015-09-17 NOTE — Telephone Encounter (Signed)
Pt called about need a refill for metoprolol succinate (TOPROL-XL) 50 MG 24 hr tablet. Pharmacy is [facility], [address]. Call pt @ [phone]. Thank you!

## 2015-09-17 NOTE — Telephone Encounter (Signed)
Rx filled

## 2015-10-07 DIAGNOSIS — L718 Other rosacea: Secondary | ICD-10-CM | POA: Diagnosis not present

## 2015-10-07 DIAGNOSIS — L820 Inflamed seborrheic keratosis: Secondary | ICD-10-CM | POA: Diagnosis not present

## 2015-10-07 DIAGNOSIS — L578 Other skin changes due to chronic exposure to nonionizing radiation: Secondary | ICD-10-CM | POA: Diagnosis not present

## 2015-10-08 ENCOUNTER — Encounter: Payer: 59 | Admitting: Internal Medicine

## 2015-10-23 ENCOUNTER — Encounter: Payer: Self-pay | Admitting: Internal Medicine

## 2015-10-23 ENCOUNTER — Ambulatory Visit (INDEPENDENT_AMBULATORY_CARE_PROVIDER_SITE_OTHER): Payer: 59 | Admitting: Internal Medicine

## 2015-10-23 ENCOUNTER — Encounter (INDEPENDENT_AMBULATORY_CARE_PROVIDER_SITE_OTHER): Payer: Self-pay

## 2015-10-23 ENCOUNTER — Other Ambulatory Visit (HOSPITAL_COMMUNITY)
Admission: RE | Admit: 2015-10-23 | Discharge: 2015-10-23 | Disposition: A | Discharge status: Home / Self Care | Payer: 59 | Source: Ambulatory Visit | Attending: Internal Medicine | Admitting: Internal Medicine

## 2015-10-23 VITALS — BP 118/80 | HR 71 | Temp 98.0°F | Resp 12 | Ht 63.0 in | Wt 173.0 lb

## 2015-10-23 DIAGNOSIS — E559 Vitamin D deficiency, unspecified: Secondary | ICD-10-CM | POA: Diagnosis not present

## 2015-10-23 DIAGNOSIS — Z1239 Encounter for other screening for malignant neoplasm of breast: Secondary | ICD-10-CM

## 2015-10-23 DIAGNOSIS — R5383 Other fatigue: Secondary | ICD-10-CM | POA: Diagnosis not present

## 2015-10-23 DIAGNOSIS — Z01419 Encounter for gynecological examination (general) (routine) without abnormal findings: Secondary | ICD-10-CM | POA: Insufficient documentation

## 2015-10-23 DIAGNOSIS — Z7189 Other specified counseling: Secondary | ICD-10-CM | POA: Diagnosis not present

## 2015-10-23 DIAGNOSIS — Z124 Encounter for screening for malignant neoplasm of cervix: Secondary | ICD-10-CM

## 2015-10-23 DIAGNOSIS — Z1151 Encounter for screening for human papillomavirus (HPV): Secondary | ICD-10-CM | POA: Insufficient documentation

## 2015-10-23 DIAGNOSIS — E785 Hyperlipidemia, unspecified: Secondary | ICD-10-CM

## 2015-10-23 DIAGNOSIS — I499 Cardiac arrhythmia, unspecified: Secondary | ICD-10-CM

## 2015-10-23 DIAGNOSIS — Z0000 Encounter for general adult medical examination without abnormal findings: Secondary | ICD-10-CM

## 2015-10-23 DIAGNOSIS — N952 Postmenopausal atrophic vaginitis: Secondary | ICD-10-CM | POA: Diagnosis not present

## 2015-10-23 DIAGNOSIS — Z7289 Other problems related to lifestyle: Secondary | ICD-10-CM | POA: Diagnosis not present

## 2015-10-23 DIAGNOSIS — I498 Other specified cardiac arrhythmias: Secondary | ICD-10-CM

## 2015-10-23 DIAGNOSIS — Z7184 Encounter for health counseling related to travel: Secondary | ICD-10-CM

## 2015-10-23 LAB — HIV ANTIBODY (ROUTINE TESTING W REFLEX): HIV 1&2 Ab, 4th Generation: NONREACTIVE

## 2015-10-23 LAB — HEPATITIS C ANTIBODY: HCV Ab: NEGATIVE

## 2015-10-23 MED ORDER — DILTIAZEM HCL ER COATED BEADS 120 MG PO CP24: 120.0000 mg | ORAL_CAPSULE | Freq: Every day | ORAL | Status: DC

## 2015-10-23 MED ORDER — DOXYCYCLINE HYCLATE 100 MG PO CAPS: 100.0000 mg | ORAL_CAPSULE | Freq: Two times a day (BID) | ORAL | Status: DC

## 2015-10-23 MED ORDER — ESTROGENS, CONJUGATED 0.625 MG/GM VA CREA: 1.0000 | TOPICAL_CREAM | Freq: Every day | VAGINAL | Status: DC

## 2015-10-23 MED ORDER — OSELTAMIVIR PHOSPHATE 75 MG PO CAPS: 75.0000 mg | ORAL_CAPSULE | Freq: Two times a day (BID) | ORAL | Status: DC

## 2015-10-23 MED ORDER — PREDNISONE 10 MG PO TABS: ORAL_TABLET | ORAL | Status: DC

## 2015-10-23 MED ORDER — LEVOFLOXACIN 500 MG PO TABS: 500.0000 mg | ORAL_TABLET | Freq: Every day | ORAL | Status: DC

## 2015-10-23 NOTE — Patient Instructions (Signed)
I have prescribed the following medications for the following scenarios:  Levaquin for UTI , bronchitis or sinusitis .  Add prednisone taper if sinusitis or bronchitis  Doxycycline if you develop a painful boil that does not start to drain with hot compresses.  Tamiflu:  One dose daily for exposure to flu withotut sympomts  Twice daily for ex;osure to flu with symptoms'  KEEP A MASK IN YOUR POCKET AND WEAR IT ON THE FLIGHT IF YOU GET STUCK NEXT TO A SICK PERSON    Premarin vaginal cream : use every night for 2 weeks,  Then reduce to twice weekly indefinitely  Please take a probiotic ( Align, Floraque or Culturelle), the generic version of one of these over the counter medications, or an alternative form (kombucha,  Yogurt, or another dietary source) for a minimum of 3 weeks IF YOU START DOXYCYCLINE OR LEVAQUIN to prevent a serious antibiotic associated diarrhea  Called clostridium dificile colitis.  Taking a probiotic may also prevent vaginitis due to yeast infections and can be continued indefinitely if you feel that it improves your digestion or your elimination (bowels).     Menopause is a normal process in which your reproductive ability comes to an end. This process happens gradually over a span of months to years, usually between the ages of 65 and 75. Menopause is complete when you have missed 12 consecutive menstrual periods. It is important to talk with your health care provider about some of the most common conditions that affect postmenopausal women, such as heart disease, cancer, and bone loss (osteoporosis). Adopting a healthy lifestyle and getting preventive care can help to promote your health and wellness. Those actions can also lower your chances of developing some of these common conditions. WHAT SHOULD I KNOW ABOUT MENOPAUSE? During menopause, you may experience a number of symptoms, such as:  Moderate-to-severe hot flashes.  Night sweats.  Decrease in sex drive.  Mood  swings.  Headaches.  Tiredness.  Irritability.  Memory problems.  Insomnia. Choosing to treat or not to treat menopausal changes is an individual decision that you make with your health care provider. WHAT SHOULD I KNOW ABOUT HORMONE REPLACEMENT THERAPY AND SUPPLEMENTS? Hormone therapy products are effective for treating symptoms that are associated with menopause, such as hot flashes and night sweats. Hormone replacement carries certain risks, especially as you become older. If you are thinking about using estrogen or estrogen with progestin treatments, discuss the benefits and risks with your health care provider. WHAT SHOULD I KNOW ABOUT HEART DISEASE AND STROKE? Heart disease, heart attack, and stroke become more likely as you age. This may be due, in part, to the hormonal changes that your body experiences during menopause. These can affect how your body processes dietary fats, triglycerides, and cholesterol. Heart attack and stroke are both medical emergencies. There are many things that you can do to help prevent heart disease and stroke:  Have your blood pressure checked at least every 1-2 years. High blood pressure causes heart disease and increases the risk of stroke.  If you are 19-38 years old, ask your health care provider if you should take aspirin to prevent a heart attack or a stroke.  Do not use any tobacco products, including cigarettes, chewing tobacco, or electronic cigarettes. If you need help quitting, ask your health care provider.  It is important to eat a healthy diet and maintain a healthy weight.  Be sure to include plenty of vegetables, fruits, low-fat dairy products, and lean protein.  Avoid eating foods that are high in solid fats, added sugars, or salt (sodium).  Get regular exercise. This is one of the most important things that you can do for your health.  Try to exercise for at least 150 minutes each week. The type of exercise that you do should  increase your heart rate and make you sweat. This is known as moderate-intensity exercise.  Try to do strengthening exercises at least twice each week. Do these in addition to the moderate-intensity exercise.  Know your numbers.Ask your health care provider to check your cholesterol and your blood glucose. Continue to have your blood tested as directed by your health care provider. WHAT SHOULD I KNOW ABOUT CANCER SCREENING? There are several types of cancer. Take the following steps to reduce your risk and to catch any cancer development as early as possible. Breast Cancer  Practice breast self-awareness.  This means understanding how your breasts normally appear and feel.  It also means doing regular breast self-exams. Let your health care provider know about any changes, no matter how small.  If you are 84 or older, have a clinician do a breast exam (clinical breast exam or CBE) every year. Depending on your age, family history, and medical history, it may be recommended that you also have a yearly breast X-ray (mammogram).  If you have a family history of breast cancer, talk with your health care provider about genetic screening.  If you are at high risk for breast cancer, talk with your health care provider about having an MRI and a mammogram every year.  Breast cancer (BRCA) gene test is recommended for women who have family members with BRCA-related cancers. Results of the assessment will determine the need for genetic counseling and BRCA1 and for BRCA2 testing. BRCA-related cancers include these types:  Breast. This occurs in males or females.  Ovarian.  Tubal. This may also be called fallopian tube cancer.  Cancer of the abdominal or pelvic lining (peritoneal cancer).  Prostate.  Pancreatic. Cervical, Uterine, and Ovarian Cancer Your health care provider may recommend that you be screened regularly for cancer of the pelvic organs. These include your ovaries, uterus, and  vagina. This screening involves a pelvic exam, which includes checking for microscopic changes to the surface of your cervix (Pap test).  For women ages 21-65, health care providers may recommend a pelvic exam and a Pap test every three years. For women ages 43-65, they may recommend the Pap test and pelvic exam, combined with testing for human papilloma virus (HPV), every five years. Some types of HPV increase your risk of cervical cancer. Testing for HPV may also be done on women of any age who have unclear Pap test results.  Other health care providers may not recommend any screening for nonpregnant women who are considered low risk for pelvic cancer and have no symptoms. Ask your health care provider if a screening pelvic exam is right for you.  If you have had past treatment for cervical cancer or a condition that could lead to cancer, you need Pap tests and screening for cancer for at least 20 years after your treatment. If Pap tests have been discontinued for you, your risk factors (such as having a new sexual partner) need to be reassessed to determine if you should start having screenings again. Some women have medical problems that increase the chance of getting cervical cancer. In these cases, your health care provider may recommend that you have screening and Pap tests more  often.  If you have a family history of uterine cancer or ovarian cancer, talk with your health care provider about genetic screening.  If you have vaginal bleeding after reaching menopause, tell your health care provider.  There are currently no reliable tests available to screen for ovarian cancer. Lung Cancer Lung cancer screening is recommended for adults 55-66 years old who are at high risk for lung cancer because of a history of smoking. A yearly low-dose CT scan of the lungs is recommended if you:  Currently smoke.  Have a history of at least 30 pack-years of smoking and you currently smoke or have quit within  the past 15 years. A pack-year is smoking an average of one pack of cigarettes per day for one year. Yearly screening should:  Continue until it has been 15 years since you quit.  Stop if you develop a health problem that would prevent you from having lung cancer treatment. Colorectal Cancer  This type of cancer can be detected and can often be prevented.  Routine colorectal cancer screening usually begins at age 13 and continues through age 30.  If you have risk factors for colon cancer, your health care provider may recommend that you be screened at an earlier age.  If you have a family history of colorectal cancer, talk with your health care provider about genetic screening.  Your health care provider may also recommend using home test kits to check for hidden blood in your stool.  A small camera at the end of a tube can be used to examine your colon directly (sigmoidoscopy or colonoscopy). This is done to check for the earliest forms of colorectal cancer.  Direct examination of the colon should be repeated every 5-10 years until age 85. However, if early forms of precancerous polyps or small growths are found or if you have a family history or genetic risk for colorectal cancer, you may need to be screened more often. Skin Cancer  Check your skin from head to toe regularly.  Monitor any moles. Be sure to tell your health care provider:  About any new moles or changes in moles, especially if there is a change in a mole's shape or color.  If you have a mole that is larger than the size of a pencil eraser.  If any of your family members has a history of skin cancer, especially at a young age, talk with your health care provider about genetic screening.  Always use sunscreen. Apply sunscreen liberally and repeatedly throughout the day.  Whenever you are outside, protect yourself by wearing long sleeves, pants, a wide-brimmed hat, and sunglasses. WHAT SHOULD I KNOW ABOUT  OSTEOPOROSIS? Osteoporosis is a condition in which bone destruction happens more quickly than new bone creation. After menopause, you may be at an increased risk for osteoporosis. To help prevent osteoporosis or the bone fractures that can happen because of osteoporosis, the following is recommended:  If you are 35-72 years old, get at least 1,000 mg of calcium and at least 600 mg of vitamin D per day.  If you are older than age 69 but younger than age 43, get at least 1,200 mg of calcium and at least 600 mg of vitamin D per day.  If you are older than age 11, get at least 1,200 mg of calcium and at least 800 mg of vitamin D per day. Smoking and excessive alcohol intake increase the risk of osteoporosis. Eat foods that are rich in calcium and vitamin  D, and do weight-bearing exercises several times each week as directed by your health care provider. WHAT SHOULD I KNOW ABOUT HOW MENOPAUSE AFFECTS [place]? Depression may occur at any age, but it is more common as you become older. Common symptoms of depression include:  Low or sad mood.  Changes in sleep patterns.  Changes in appetite or eating patterns.  Feeling an overall lack of motivation or enjoyment of activities that you previously enjoyed.  Frequent crying spells. Talk with your health care provider if you think that you are experiencing depression. WHAT SHOULD I KNOW ABOUT IMMUNIZATIONS? It is important that you get and maintain your immunizations. These include:  Tetanus, diphtheria, and pertussis (Tdap) booster vaccine.  Influenza every year before the flu season begins.  Pneumonia vaccine.  Shingles vaccine. Your health care provider may also recommend other immunizations.   This information is not intended to replace advice given to you by your health care provider. Make sure you discuss any questions you have with your health care provider.   Document Released: 10/02/2005 Document Revised: 08/31/2014 Document  Reviewed: 04/12/2014 Elsevier Interactive Patient Education [employer].

## 2015-10-23 NOTE — Progress Notes (Signed)
Pre-visit discussion using our clinic review tool. No additional management support is needed unless otherwise documented below in the visit note.  

## 2015-10-23 NOTE — Progress Notes (Signed)
Patient ID: Anne Ingram, female    DOB: [birth year]  Age: 60 y.o. MRN: [id number]  The patient is here for annual  wellness examination and management of other chronic and acute problems.  Mammogram 3/16 normal  PAP due  Colonoscopy Dec 2013 [name] 1 polyp  Hyperplastic  The risk factors are reflected in the social history.  The roster of all physicians providing medical care to patient - is listed in the Snapshot section of the chart.  Activities of daily living:  The patient is 100% independent in all ADLs: dressing, toileting, feeding as well as independent mobility  Home safety : The patient has smoke detectors in the home. They wear seatbelts.  There are no firearms at home. There is no violence in the home.   There is no risks for hepatitis, STDs or HIV. There is no   history of blood transfusion. They have no travel history to infectious disease endemic areas of the world.  The patient has seen their dentist in the last six month. They have seen their eye doctor in the last year. They admit to slight hearing difficulty with regard to whispered voices and some television programs.  They have deferred audiologic testing in the last year.  They do not  have excessive sun exposure. Discussed the need for sun protection: hats, long sleeves and use of sunscreen if there is significant sun exposure.   Diet: the importance of a healthy diet is discussed. They do have a healthy diet.  The benefits of regular aerobic exercise were discussed. She walks 4 times per week ,  20 minutes.  dDepression screen: there are no signs or vegative symptoms of depression- irritability, change in appetite, anhedonia, sadness/tearfullness.  Cognitive assessment: the patient manages all their financial and personal affairs and is actively engaged. They could relate day,date,year and events; recalled 2/3 objects at 3 minutes; performed clock-face test normally.  The following portions of the patient's history  were reviewed and updated as appropriate: allergies, current medications, past family history, past medical history,  past surgical history, past social history  and problem list.  Visual acuity was not assessed per patient preference since she has regular follow up with her ophthalmologist. Hearing and body mass index were assessed and reviewed.   During the course of the visit the patient was educated and counseled about appropriate screening and preventive services including : fall prevention , diabetes screening, nutrition counseling, colorectal cancer screening, and recommended immunizations.    CC: The primary encounter diagnosis was Encounter for preventive health examination. Diagnoses of Postmenopausal atrophic vaginitis, Hyperlipidemia, Other problems related to lifestyle, Other fatigue, Cervical cancer screening, Breast cancer screening, Vitamin D deficiency, Arrhythmia, atrial, and Travel advice encounter were also pertinent to this visit.   Had several episodess of rapid heart rate since stopping diltiazem ,  One episode lasted 12 hours, and did not respond to an extra dose of metoprolol ER.  She has a history of PAF and has also not been taking baby asa.   History Anne Ingram has a past medical history of Chicken pox; Cardiac arrhythmia due to congenital heart disease; Elevated blood pressure reading; and Hypertension.   She has no past surgical history on file.   Her family history includes Hypertension in her mother; Mental illness in her mother. There is no history of Cancer.She reports that she has quit smoking. She does not have any smokeless tobacco history on file. She reports that she drinks about 2.5 oz of alcohol per  week. She reports that she does not use illicit drugs.  Outpatient Prescriptions Prior to Visit  Medication Sig Dispense Refill  . metoprolol succinate (TOPROL-XL) 50 MG 24 hr tablet TAKE 1 TABLET BY MOUTH ONCE DAILY. TAKE WITH OR IMMEDIATELY FOLLOWING A MEAL 90  tablet 3  . aspirin 81 MG tablet Take 81 mg by mouth daily. Reported on 10/23/2015     No facility-administered medications prior to visit.    Review of Systems   Patient denies headache, fevers, malaise, unintentional weight loss, skin rash, eye pain, sinus congestion and sinus pain, sore throat, dysphagia,  hemoptysis , cough, dyspnea, wheezing, chest pain, palpitations, orthopnea, edema, abdominal pain, nausea, melena, diarrhea, constipation, flank pain, dysuria, hematuria, urinary  Frequency, nocturia, numbness, tingling, seizures,  Focal weakness, Loss of consciousness,  Tremor, insomnia, depression, anxiety, and suicidal ideation.    Objective:  BP 118/80 mmHg  Pulse 71  Temp(Src) 98 F (36.7 C) (Oral)  Resp 12  Ht 5\' 3"  (1.6 m)  Wt 173 lb (78.472 kg)  BMI 30.65 kg/m2  SpO2 98%  Physical Exam   General Appearance:    Alert, cooperative, no distress, appears stated age  Head:    Normocephalic, without obvious abnormality, atraumatic  Eyes:    PERRL, conjunctiva/corneas clear, EOM's intact, fundi    benign, both eyes  Ears:    Normal TM's and external ear canals, both ears  Nose:   Nares normal, septum midline, mucosa normal, no drainage    or sinus tenderness  Throat:   Lips, mucosa, and tongue normal; teeth and gums normal  Neck:   Supple, symmetrical, trachea midline, no adenopathy;    thyroid:  no enlargement/tenderness/nodules; no carotid   bruit or JVD  Back:     Symmetric, no curvature, ROM normal, no CVA tenderness  Lungs:     Clear to auscultation bilaterally, respirations unlabored  Chest Wall:    No tenderness or deformity   Heart:    Regular rate and rhythm, S1 and S2 normal, no murmur, rub   or gallop  Breast Exam:    No tenderness, masses, or nipple abnormality  Abdomen:     Soft, non-tender, bowel sounds active all four quadrants,    no masses, no organomegaly  Genitalia:    Pelvic: cervix normal in appearance, external genitalia normal, no adnexal masses or  tenderness, no cervical motion tenderness, rectovaginal septum normal, uterus normal size, shape, and consistency and vagina atrophic without discharge  Extremities:   Extremities normal, atraumatic, no cyanosis or edema  Pulses:   2+ and symmetric all extremities  Skin:   Skin color, texture, turgor normal, no rashes or lesions  Lymph nodes:   Cervical, supraclavicular, and axillary nodes normal  Neurologic:   CNII-XII intact, normal strength, sensation and reflexes    throughout       Assessment & Plan:   Problem List Items Addressed This Visit    Arrhythmia, atrial    Resuming diltiazem CD low dose given two recent episoes that did not respond to extra dose of long acting metoprolol      Relevant Medications   diltiazem (CARDIZEM CD) 120 MG 24 hr capsule   Encounter for preventive health examination - Primary    Annual comprehensive preventive exam was done as well as an evaluation and management of chronic conditions .  During the course of the visit the patient was educated and counseled about appropriate screening and preventive services including :  diabetes screening, lipid analysis with  projected  10 year  risk for CAD , nutrition counseling, breast, cervical and colorectal cancer screening, and recommended immunizations.  Printed recommendations for health maintenance screenings was given      Postmenopausal atrophic vaginitis    Premarin cream trial discussed.       Travel advice encounter    Patient was given rx for Levaquin and promethazine with instructions on when to use, and protective masks to use on flight if indicated.          Other Visit Diagnoses    Hyperlipidemia        Relevant Medications    diltiazem (CARDIZEM CD) 120 MG 24 hr capsule    Other Relevant Orders    Lipid panel (Completed)    LDL cholesterol, direct (Completed)    Other problems related to lifestyle        Relevant Orders    Hepatitis C antibody (Completed)    HIV antibody (Completed)     Other fatigue        Relevant Orders    TSH (Completed)    CBC with Differential/Platelet (Completed)    Comprehensive metabolic panel (Completed)    Cervical cancer screening        Relevant Orders    Cytology - PAP (Completed)    Breast cancer screening        Relevant Orders    MM DIGITAL SCREENING BILATERAL    Vitamin D deficiency        Relevant Orders    VITAMIN D 25 Hydroxy (Vit-D Deficiency, Fractures) (Completed)       I have changed Ms. Rallis's diltiazem. I am also having her start on predniSONE, levofloxacin, oseltamivir, doxycycline, and conjugated estrogens. Additionally, I am having her maintain her aspirin and metoprolol succinate.  Meds ordered this encounter  Medications  . predniSONE (DELTASONE) 10 MG tablet    Sig: 6 tablets on Day 1 , then reduce by 1 tablet daily until gone    Dispense:  21 tablet    Refill:  0  . levofloxacin (LEVAQUIN) 500 MG tablet    Sig: Take 1 tablet (500 mg total) by mouth daily.    Dispense:  7 tablet    Refill:  0  . oseltamivir (TAMIFLU) 75 MG capsule    Sig: Take 1 capsule (75 mg total) by mouth 2 (two) times daily.    Dispense:  10 capsule    Refill:  0  . diltiazem (CARDIZEM CD) 120 MG 24 hr capsule    Sig: Take 1 capsule (120 mg total) by mouth daily.    Dispense:  30 capsule    Refill:  3  . doxycycline (VIBRAMYCIN) 100 MG capsule    Sig: Take 1 capsule (100 mg total) by mouth 2 (two) times daily.    Dispense:  14 capsule    Refill:  0  . conjugated estrogens (PREMARIN) vaginal cream    Sig: Place 1 Applicatorful vaginally daily.    Dispense:  42.5 g    Refill:  12    Medications Discontinued During This Encounter  Medication Reason  . diltiazem (CARDIZEM CD) 120 MG 24 hr capsule Reorder    Follow-up: No Follow-up on file.   Crecencio Mc, MD

## 2015-10-24 LAB — CBC WITH DIFFERENTIAL/PLATELET
Basophils Absolute: 0.1 10*3/uL (ref 0.0–0.1)
Basophils Relative: 1.1 % (ref 0.0–3.0)
Eosinophils Absolute: 0.3 10*3/uL (ref 0.0–0.7)
Eosinophils Relative: 3 % (ref 0.0–5.0)
HCT: 39.2 % (ref 36.0–46.0)
Hemoglobin: 13.2 g/dL (ref 12.0–15.0)
Lymphocytes Relative: 44.4 % (ref 12.0–46.0)
Lymphs Abs: 4.1 10*3/uL — ABNORMAL HIGH (ref 0.7–4.0)
MCHC: 33.7 g/dL (ref 30.0–36.0)
MCV: 86.5 fl (ref 78.0–100.0)
Monocytes Absolute: 0.5 10*3/uL (ref 0.1–1.0)
Monocytes Relative: 5 % (ref 3.0–12.0)
Neutro Abs: 4.3 10*3/uL (ref 1.4–7.7)
Neutrophils Relative %: 46.5 % (ref 43.0–77.0)
Platelets: 306 10*3/uL (ref 150.0–400.0)
RBC: 4.53 Mil/uL (ref 3.87–5.11)
RDW: 12.7 % (ref 11.5–15.5)
WBC: 9.2 10*3/uL (ref 4.0–10.5)

## 2015-10-24 LAB — LIPID PANEL
Cholesterol: 216 mg/dL — ABNORMAL HIGH (ref 0–200)
HDL: 49.6 mg/dL (ref >39.00)
NonHDL: 166.58
Total CHOL/HDL Ratio: 4
Triglycerides: 223 mg/dL — ABNORMAL HIGH (ref 0.0–149.0)
VLDL: 44.6 mg/dL — ABNORMAL HIGH (ref 0.0–40.0)

## 2015-10-24 LAB — COMPREHENSIVE METABOLIC PANEL
ALT: 21 U/L (ref 0–35)
AST: 21 U/L (ref 0–37)
Albumin: 4.3 g/dL (ref 3.5–5.2)
Alkaline Phosphatase: 65 U/L (ref 39–117)
BUN: 19 mg/dL (ref 6–23)
CO2: 27 mEq/L (ref 19–32)
Calcium: 9.7 mg/dL (ref 8.4–10.5)
Chloride: 105 mEq/L (ref 96–112)
Creatinine, Ser: 0.84 mg/dL (ref 0.40–1.20)
GFR: 73.72 mL/min (ref >60.00)
Glucose, Bld: 98 mg/dL (ref 70–99)
Potassium: 4.1 mEq/L (ref 3.5–5.1)
Sodium: 139 mEq/L (ref 135–145)
Total Bilirubin: 0.3 mg/dL (ref 0.2–1.2)
Total Protein: 7.4 g/dL (ref 6.0–8.3)

## 2015-10-24 LAB — VITAMIN D 25 HYDROXY (VIT D DEFICIENCY, FRACTURES): VITD: 22.44 ng/mL — ABNORMAL LOW (ref 30.00–100.00)

## 2015-10-24 LAB — TSH: TSH: 1.36 u[IU]/mL (ref 0.35–4.50)

## 2015-10-24 LAB — LDL CHOLESTEROL, DIRECT: Direct LDL: 135 mg/dL

## 2015-10-25 LAB — CYTOLOGY - PAP

## 2015-10-26 DIAGNOSIS — Z7184 Encounter for health counseling related to travel: Secondary | ICD-10-CM | POA: Insufficient documentation

## 2015-10-26 DIAGNOSIS — T753XXA Motion sickness, initial encounter: Secondary | ICD-10-CM | POA: Insufficient documentation

## 2015-10-26 NOTE — Assessment & Plan Note (Signed)
Premarin cream trial discussed.

## 2015-10-26 NOTE — Assessment & Plan Note (Addendum)
Annual comprehensive preventive exam was done as well as an evaluation and management of chronic conditions .  During the course of the visit the patient was educated and counseled about appropriate screening and preventive services including :  diabetes screening, lipid analysis with projected  10 year  risk for CAD , nutrition counseling, breast, cervical and colorectal cancer screening, and recommended immunizations.  Printed recommendations for health maintenance screenings was given 

## 2015-10-26 NOTE — Assessment & Plan Note (Signed)
Resuming diltiazem CD low dose given two recent episoes that did not respond to extra dose of long acting metoprolol

## 2015-10-26 NOTE — Assessment & Plan Note (Signed)
Patient was given rx for Levaquin and promethazine with instructions on when to use, and protective masks to use on flight if indicated.

## 2015-10-28 DIAGNOSIS — E559 Vitamin D deficiency, unspecified: Secondary | ICD-10-CM | POA: Insufficient documentation

## 2015-10-28 MED ORDER — ERGOCALCIFEROL 1.25 MG (50000 UT) PO CAPS: 50000.0000 [IU] | ORAL_CAPSULE | ORAL | Status: DC

## 2015-10-28 NOTE — Addendum Note (Signed)
Addended by: Crecencio Mc on: 10/28/2015 08:42 AM   Modules accepted: Orders, SmartSet

## 2016-01-03 DIAGNOSIS — C44319 Basal cell carcinoma of skin of other parts of face: Secondary | ICD-10-CM | POA: Diagnosis not present

## 2016-02-17 ENCOUNTER — Telehealth: Payer: Self-pay | Admitting: *Deleted

## 2016-02-17 MED ORDER — DILTIAZEM HCL ER COATED BEADS 120 MG PO CP24: 120.0000 mg | ORAL_CAPSULE | Freq: Every day | ORAL | Status: DC

## 2016-02-17 NOTE — Telephone Encounter (Signed)
Rx sent 

## 2016-02-17 NOTE — Telephone Encounter (Signed)
Patient has requested a medication refill for diltiazem Pharmacy [facility]

## 2016-04-06 DIAGNOSIS — L739 Follicular disorder, unspecified: Secondary | ICD-10-CM | POA: Diagnosis not present

## 2016-04-06 DIAGNOSIS — L821 Other seborrheic keratosis: Secondary | ICD-10-CM | POA: Diagnosis not present

## 2016-04-06 DIAGNOSIS — L578 Other skin changes due to chronic exposure to nonionizing radiation: Secondary | ICD-10-CM | POA: Diagnosis not present

## 2016-04-06 DIAGNOSIS — Z1283 Encounter for screening for malignant neoplasm of skin: Secondary | ICD-10-CM | POA: Diagnosis not present

## 2016-04-06 DIAGNOSIS — D229 Melanocytic nevi, unspecified: Secondary | ICD-10-CM | POA: Diagnosis not present

## 2016-04-06 DIAGNOSIS — Z85828 Personal history of other malignant neoplasm of skin: Secondary | ICD-10-CM | POA: Diagnosis not present

## 2016-04-06 DIAGNOSIS — D1800 Hemangioma unspecified site: Secondary | ICD-10-CM | POA: Diagnosis not present

## 2016-04-06 DIAGNOSIS — L570 Actinic keratosis: Secondary | ICD-10-CM | POA: Diagnosis not present

## 2016-04-06 DIAGNOSIS — L718 Other rosacea: Secondary | ICD-10-CM | POA: Diagnosis not present

## 2016-05-19 DIAGNOSIS — C44319 Basal cell carcinoma of skin of other parts of face: Secondary | ICD-10-CM | POA: Diagnosis not present

## 2016-06-11 DIAGNOSIS — H524 Presbyopia: Secondary | ICD-10-CM | POA: Diagnosis not present

## 2016-06-17 ENCOUNTER — Other Ambulatory Visit: Payer: Self-pay | Admitting: Internal Medicine

## 2016-07-09 ENCOUNTER — Ambulatory Visit (INDEPENDENT_AMBULATORY_CARE_PROVIDER_SITE_OTHER): Payer: 59

## 2016-07-09 DIAGNOSIS — Z23 Encounter for immunization: Secondary | ICD-10-CM

## 2016-09-21 ENCOUNTER — Other Ambulatory Visit: Payer: Self-pay | Admitting: Internal Medicine

## 2016-09-28 ENCOUNTER — Other Ambulatory Visit: Payer: Self-pay | Admitting: Internal Medicine

## 2016-09-28 DIAGNOSIS — I498 Other specified cardiac arrhythmias: Secondary | ICD-10-CM

## 2016-09-28 NOTE — Telephone Encounter (Signed)
Patient needs appt for further refills.

## 2016-10-09 DIAGNOSIS — Z85828 Personal history of other malignant neoplasm of skin: Secondary | ICD-10-CM | POA: Diagnosis not present

## 2016-10-09 DIAGNOSIS — L859 Epidermal thickening, unspecified: Secondary | ICD-10-CM | POA: Diagnosis not present

## 2016-10-09 DIAGNOSIS — L304 Erythema intertrigo: Secondary | ICD-10-CM | POA: Diagnosis not present

## 2016-10-09 DIAGNOSIS — L219 Seborrheic dermatitis, unspecified: Secondary | ICD-10-CM | POA: Diagnosis not present

## 2016-10-09 DIAGNOSIS — C44319 Basal cell carcinoma of skin of other parts of face: Secondary | ICD-10-CM | POA: Diagnosis not present

## 2016-10-09 DIAGNOSIS — L570 Actinic keratosis: Secondary | ICD-10-CM | POA: Diagnosis not present

## 2016-10-09 DIAGNOSIS — L718 Other rosacea: Secondary | ICD-10-CM | POA: Diagnosis not present

## 2016-10-30 ENCOUNTER — Other Ambulatory Visit: Payer: Self-pay | Admitting: Internal Medicine

## 2016-10-30 DIAGNOSIS — I498 Other specified cardiac arrhythmias: Secondary | ICD-10-CM

## 2016-11-11 ENCOUNTER — Telehealth: Payer: Self-pay | Admitting: Internal Medicine

## 2016-11-11 DIAGNOSIS — I498 Other specified cardiac arrhythmias: Secondary | ICD-10-CM

## 2016-11-11 DIAGNOSIS — E559 Vitamin D deficiency, unspecified: Secondary | ICD-10-CM

## 2016-11-11 DIAGNOSIS — R635 Abnormal weight gain: Secondary | ICD-10-CM

## 2016-11-11 MED ORDER — METOPROLOL SUCCINATE ER 50 MG PO TB24: 50.0000 mg | ORAL_TABLET | Freq: Every day | ORAL | 0 refills | Status: DC

## 2016-11-11 MED ORDER — DILTIAZEM HCL ER COATED BEADS 120 MG PO CP24
120.0000 mg | ORAL_CAPSULE | Freq: Every day | ORAL | 0 refills | Status: DC
Start: 2016-11-11 — End: 2017-02-16

## 2016-11-11 NOTE — Telephone Encounter (Signed)
Spoke with pt and scheduled her a lab appt for 01/04/2017 @ 9:15am. Pt is aware that she needs to be fasting.

## 2016-11-11 NOTE — Telephone Encounter (Signed)
Cartia refilled on 09/21/2016   Metoprolol refilled on 10/30/2016  Last OV: 10/23/2015 Next OV: 01/07/2017  Last Labs: 10/23/2015

## 2016-11-11 NOTE — Telephone Encounter (Signed)
Pt called and is requesting refills for CARTIA XT 120 MG 24 hr capsule, and metoprolol succinate (TOPROL-XL) 50 MG 24 hr tablet. Please advise, thank you!  Pharmacy - [place], [address]  Call pt @ [phone]

## 2016-11-11 NOTE — Telephone Encounter (Signed)
meds refilled  . Fasting labs needed prior to appt. Please schedule

## 2016-12-08 ENCOUNTER — Other Ambulatory Visit: Payer: Self-pay | Admitting: Internal Medicine

## 2016-12-21 ENCOUNTER — Ambulatory Visit (INDEPENDENT_AMBULATORY_CARE_PROVIDER_SITE_OTHER): Payer: 59 | Admitting: Family

## 2016-12-21 ENCOUNTER — Ambulatory Visit (INDEPENDENT_AMBULATORY_CARE_PROVIDER_SITE_OTHER): Payer: 59

## 2016-12-21 VITALS — BP 135/85 | HR 85 | Temp 99.7°F | Ht 63.0 in | Wt 166.8 lb

## 2016-12-21 DIAGNOSIS — J40 Bronchitis, not specified as acute or chronic: Secondary | ICD-10-CM

## 2016-12-21 DIAGNOSIS — I517 Cardiomegaly: Secondary | ICD-10-CM | POA: Diagnosis not present

## 2016-12-21 MED ORDER — HYDROCODONE-HOMATROPINE 5-1.5 MG/5ML PO SYRP: 5.0000 mL | ORAL_SOLUTION | Freq: Every evening | ORAL | 0 refills | Status: DC | PRN

## 2016-12-21 MED ORDER — ALBUTEROL SULFATE HFA 108 (90 BASE) MCG/ACT IN AERS: 2.0000 | INHALATION_SPRAY | Freq: Four times a day (QID) | RESPIRATORY_TRACT | 1 refills | Status: DC | PRN

## 2016-12-21 NOTE — Progress Notes (Signed)
Subjective:    Patient ID: Anne Ingram, female    DOB: 03-15-1956, 61 y.o.   MRN: [id number]  CC: Anne Ingram is a 61 y.o. female who presents today for an acute visit.    HPI: CC: productive cough x 5 days, worsening. Endorses chills ( 2 days ago, resolved), loss of voice.  No sob, wheezing, fever, HA, vision changes, sinus pressure, ear pain.   Tried mucinex dm, claritin, delsym without much relief.   No h/o lung disease  h/o arrhythmia on diltiazem.    Former smoker      HISTORY:  Past Medical History:  Diagnosis Date  . Cardiac arrhythmia due to congenital heart disease   . Chicken pox   . Elevated blood pressure reading   . Hypertension    History reviewed. No pertinent surgical history. Family History  Problem Relation Age of Onset  . Hypertension Mother   . Mental illness Mother   . Cancer Neg Hx     Allergies: Penicillins Current Outpatient Prescriptions on File Prior to Visit  Medication Sig Dispense Refill  . aspirin 81 MG tablet Take 81 mg by mouth daily. Reported on 10/23/2015    . diltiazem (CARTIA XT) 120 MG 24 hr capsule Take 1 capsule (120 mg total) by mouth daily. 90 capsule 0  . doxycycline (VIBRAMYCIN) 100 MG capsule Take 1 capsule (100 mg total) by mouth 2 (two) times daily. 14 capsule 0  . metoprolol succinate (TOPROL-XL) 50 MG 24 hr tablet Take 1 tablet (50 mg total) by mouth daily. Take with or immediately following a meal. 90 tablet 0  . PREMARIN vaginal cream PLACE 1 APPLICATORFUL VAGINALLY DAILY. 30 g 0   No current facility-administered medications on file prior to visit.     Social History  Substance Use Topics  . Smoking status: Former Research scientist (life sciences)  . Smokeless tobacco: Not on file  . Alcohol use 2.5 oz/week    5 drink(s) per week     Comment: rarely    Review of Systems  Constitutional: Negative for chills and fever.  HENT: Positive for congestion, sore throat and voice change. Negative for ear discharge, ear pain and sinus  pressure.   Respiratory: Positive for cough. Negative for shortness of breath and wheezing.   Cardiovascular: Negative for chest pain and palpitations.  Gastrointestinal: Negative for nausea and vomiting.      Objective:    BP 135/85   Pulse 85   Temp 99.7 F (37.6 C) (Oral)   Ht 5\' 3"  (1.6 m)   Wt 166 lb 12.8 oz (75.7 kg)   SpO2 97%   BMI 29.55 kg/m  BP Readings from Last 3 Encounters:  12/21/16 135/85  10/23/15 118/80  09/25/14 132/72   ///  Physical Exam  Constitutional: She appears well-developed and well-nourished.  HENT:  Head: Normocephalic and atraumatic.  Right Ear: Hearing, tympanic membrane, external ear and ear canal normal. No drainage, swelling or tenderness. No foreign bodies. Tympanic membrane is not erythematous and not bulging. No middle ear effusion. No decreased hearing is noted.  Left Ear: Hearing, tympanic membrane, external ear and ear canal normal. No drainage, swelling or tenderness. No foreign bodies. Tympanic membrane is not erythematous and not bulging.  No middle ear effusion. No decreased hearing is noted.  Nose: Nose normal. No rhinorrhea. Right sinus exhibits no maxillary sinus tenderness and no frontal sinus tenderness. Left sinus exhibits no maxillary sinus tenderness and no frontal sinus tenderness.  Mouth/Throat: Uvula is midline,  oropharynx is clear and moist and mucous membranes are normal. No oropharyngeal exudate, posterior oropharyngeal edema, posterior oropharyngeal erythema or tonsillar abscesses.  Eyes: Conjunctivae are normal.  Cardiovascular: Regular rhythm, normal heart sounds and normal pulses.   Pulmonary/Chest: Effort normal. She has decreased breath sounds in the right lower field. She has no wheezes. She has no rhonchi. She has no rales.  Lymphadenopathy:       Head (right side): No submental, no submandibular, no tonsillar, no preauricular, no posterior auricular and no occipital adenopathy present.       Head (left side): No  submental, no submandibular, no tonsillar, no preauricular, no posterior auricular and no occipital adenopathy present.    She has no cervical adenopathy.  Neurological: She is alert.  Skin: Skin is warm and dry.  Psychiatric: She has a normal mood and affect. Her speech is normal and behavior is normal. Thought content normal.  Vitals reviewed. Patient felt significantly better after albuterol treatment. Lung sounds clear and increased      Assessment & Plan:   1. Bronchitis Afebrile.sa02 97%. Reassured as patient improved with nebulizer. No acute respiratory distress. Pending chest x-ray to ensure a bacterial pneumonia. Otherwise, patient I discussed likely bronchitis of viral etiology. We'll manage conservatively at this time with prednisone and delayed antibiotic therapy. Patient will let me know if she's not better.  - HYDROcodone-homatropine (HYCODAN) 5-1.5 MG/5ML syrup; Take 5 mLs by mouth at bedtime as needed for cough.  Dispense: 30 mL; Refill: 0 - albuterol (PROVENTIL HFA) 108 (90 Base) MCG/ACT inhaler; Inhale 2 puffs into the lungs every 6 (six) hours as needed for wheezing or shortness of breath.  Dispense: 1 Inhaler; Refill: 1 - DG Chest 2 View    I have discontinued Ms. Bazzano's predniSONE, levofloxacin, oseltamivir, and ergocalciferol. I am also having her start on HYDROcodone-homatropine and albuterol. Additionally, I am having her maintain her aspirin, doxycycline, metoprolol succinate, diltiazem, PREMARIN, and Vitamin D3-Vitamin C.   Meds ordered this encounter  Medications  . Cholecalciferol-Vitamin C (VITAMIN D3-VITAMIN C) 1000-500 UNIT-MG CAPS    Sig: Take by mouth.  Marland Kitchen HYDROcodone-homatropine (HYCODAN) 5-1.5 MG/5ML syrup    Sig: Take 5 mLs by mouth at bedtime as needed for cough.    Dispense:  30 mL    Refill:  0    Order Specific Question:   Supervising Provider    Answer:   Derrel Nip, TERESA L [[id number]]  . albuterol (PROVENTIL HFA) 108 (90 Base) MCG/ACT inhaler     Sig: Inhale 2 puffs into the lungs every 6 (six) hours as needed for wheezing or shortness of breath.    Dispense:  1 Inhaler    Refill:  1    Order Specific Question:   Supervising Provider    Answer:   Crecencio Mc [[id number]]    Return precautions given.   Risks, benefits, and alternatives of the medications and treatment plan prescribed today were discussed, and patient expressed understanding.   Education regarding symptom management and diagnosis given to patient on AVS.  Continue to follow with TULLO, Aris Everts, MD for routine health maintenance.   [facility] and I agreed with plan.   Mable Paris, FNP

## 2016-12-21 NOTE — Patient Instructions (Addendum)
Lets await chest xray prior to antibiotic or steriod  Please take cough medication at night only as needed. As we discussed, I do not recommend dosing throughout the day as coughing is a protective mechanism . It also helps to break up thick mucous.  Do not take cough suppressants with alcohol as can lead to trouble breathing. Advise caution if taking cough suppressant and operating machinery ( i.e driving a car) as you may feel very tired.   Increase intake of clear fluids. Congestion is best treated by hydration, when mucus is wetter, it is thinner, less sticky, and easier to expel from the body, either through coughing up drainage, or by blowing your nose.   Get plenty of rest.   Use saline nasal drops and blow your nose frequently. Run a humidifier at night and elevate the head of the bed. Vicks Vapor rub will help with congestion and cough. Steam showers and sinus massage for congestion.   Use Acetaminophen or Ibuprofen as needed for fever or pain. Avoid second hand smoke. Even the smallest exposure will worsen symptoms.   Over the counter medications you can try include Delsym for cough, a decongestant for congestion, and Mucinex or Robitussin as an expectorant. Be sure to just get the plain Mucinex or Robitussin that just has one medication (Guaifenesen). We don't recommend the combination products. Note, be sure to drink two glasses of water with each dose of Mucinex as the medication will not work well without adequate hydration.   You can also try a teaspoon of honey to see if this will help reduce cough. Throat lozenges can sometimes be beneficial as well.    This illness will typically last 7 - 10 days.   Please follow up with our clinic if you develop a fever greater than 101 F, symptoms worsen, or do not resolve in the next week.

## 2016-12-21 NOTE — Progress Notes (Signed)
Pre visit review using our clinic review tool, if applicable. No additional management support is needed unless otherwise documented below in the visit note. 

## 2016-12-22 ENCOUNTER — Encounter: Payer: Self-pay | Admitting: Family

## 2016-12-22 ENCOUNTER — Telehealth: Payer: Self-pay | Admitting: Family

## 2016-12-22 DIAGNOSIS — J40 Bronchitis, not specified as acute or chronic: Secondary | ICD-10-CM

## 2016-12-22 MED ORDER — PREDNISONE 10 MG PO TABS: ORAL_TABLET | ORAL | 0 refills | Status: DC

## 2016-12-22 NOTE — Telephone Encounter (Signed)
Patient has been informed.

## 2016-12-22 NOTE — Telephone Encounter (Signed)
Call pt  Let her know that CXR does not show any acute PNA  It does show some scarring of left lung which I suspect if from h/o smoking.   Lets try 4 day taper of prednisone and see how she feels.   Let me know if not better

## 2016-12-24 ENCOUNTER — Telehealth: Payer: Self-pay | Admitting: Internal Medicine

## 2016-12-24 MED ORDER — DOXYCYCLINE HYCLATE 100 MG PO TABS: 100.0000 mg | ORAL_TABLET | Freq: Two times a day (BID) | ORAL | 0 refills | Status: DC

## 2016-12-24 MED ORDER — HYDROCODONE-HOMATROPINE 5-1.5 MG/5ML PO SYRP: 5.0000 mL | ORAL_SOLUTION | Freq: Every evening | ORAL | 0 refills | Status: DC | PRN

## 2016-12-24 NOTE — Telephone Encounter (Signed)
Pt called about wanting to speak to Dr Derrel Nip. Pt states she is a little better. Pt is still coughing at night and sound hoarse. Pt was prescribed a antibiotic and still coughing. Please advise?  Call pt @ [phone]. Thank you!

## 2016-12-24 NOTE — Addendum Note (Signed)
Addended by: Burnard Hawthorne on: 12/24/2016 02:26 PM   Modules accepted: Orders

## 2016-12-24 NOTE — Telephone Encounter (Signed)
Pt called and stated that she is not much feeling any better. She is on day 3 of prednisone, she is still coughing, congested, a sore throat, and not sleeping. She is leaving to go out of town in a couple of days for her daughter graduation. Please advise, thank you!  Call pt @ [phone]

## 2016-12-24 NOTE — Addendum Note (Signed)
Addended by: Burnard Hawthorne on: 12/24/2016 11:42 AM   Modules accepted: Orders

## 2016-12-24 NOTE — Telephone Encounter (Signed)
Patient advised of below and she verbalized understanding.   She will come pick up script.

## 2016-12-24 NOTE — Telephone Encounter (Signed)
Call pt  Sent in doxy  Please ensure probiotics during and 2 weeks after  Continue inhaler  Please advise to be seen at urgent care if symptoms do not improve

## 2016-12-24 NOTE — Telephone Encounter (Signed)
Pt called and stated that she is not much feeling any better. She is on day 3 of prednisone, still hoarse,  she is still coughing, dry cough, and productive in am, congested, a sore throat, and not sleeping due to cough. She is leaving to go out of town in a couple of days for her daughter graduation. She has been using albuterol inhaler 2 puffs bid , taking Mucinex 1 bid.  Advil prn.    Please advise, thank you! Taking hyocodan, almost out of  Call pt @ [phone]

## 2016-12-24 NOTE — Telephone Encounter (Signed)
New rx for cough syrup  Remind patient that cough medication is NIGHT time use only  Coughing is protective and breaks up mucous.   During day- honey, tea, lozenges to keep cough at bay  Inhaler every 6-8hours

## 2016-12-24 NOTE — Telephone Encounter (Signed)
LMTCB

## 2016-12-24 NOTE — Telephone Encounter (Signed)
Patient advised of below and verbalized understanding.  She is wondering what she can take for the cough due to she is almost out of hydroonde-homatropine.  Please advise. Should she just take delsym she states that didn't help before.

## 2016-12-25 NOTE — Telephone Encounter (Signed)
Spoke with pt and she stated that she had spoken with Erasmo Downer and everything has been handled.

## 2017-01-04 ENCOUNTER — Other Ambulatory Visit (INDEPENDENT_AMBULATORY_CARE_PROVIDER_SITE_OTHER): Payer: 59

## 2017-01-04 DIAGNOSIS — I498 Other specified cardiac arrhythmias: Secondary | ICD-10-CM | POA: Diagnosis not present

## 2017-01-04 DIAGNOSIS — R635 Abnormal weight gain: Secondary | ICD-10-CM | POA: Diagnosis not present

## 2017-01-04 DIAGNOSIS — E559 Vitamin D deficiency, unspecified: Secondary | ICD-10-CM

## 2017-01-04 LAB — COMPREHENSIVE METABOLIC PANEL
ALT: 16 U/L (ref 0–35)
AST: 17 U/L (ref 0–37)
Albumin: 4.1 g/dL (ref 3.5–5.2)
Alkaline Phosphatase: 45 U/L (ref 39–117)
BUN: 17 mg/dL (ref 6–23)
CO2: 26 mEq/L (ref 19–32)
Calcium: 9.2 mg/dL (ref 8.4–10.5)
Chloride: 108 mEq/L (ref 96–112)
Creatinine, Ser: 0.86 mg/dL (ref 0.40–1.20)
GFR: 71.45 mL/min (ref >60.00)
Glucose, Bld: 108 mg/dL — ABNORMAL HIGH (ref 70–99)
Potassium: 4.1 mEq/L (ref 3.5–5.1)
Sodium: 141 mEq/L (ref 135–145)
Total Bilirubin: 0.4 mg/dL (ref 0.2–1.2)
Total Protein: 6.8 g/dL (ref 6.0–8.3)

## 2017-01-04 LAB — CBC WITH DIFFERENTIAL/PLATELET
Basophils Absolute: 0 10*3/uL (ref 0.0–0.1)
Basophils Relative: 0.7 % (ref 0.0–3.0)
Eosinophils Absolute: 0.2 10*3/uL (ref 0.0–0.7)
Eosinophils Relative: 2.9 % (ref 0.0–5.0)
HCT: 39 % (ref 36.0–46.0)
Hemoglobin: 12.9 g/dL (ref 12.0–15.0)
Lymphocytes Relative: 39.9 % (ref 12.0–46.0)
Lymphs Abs: 2.9 10*3/uL (ref 0.7–4.0)
MCHC: 33.1 g/dL (ref 30.0–36.0)
MCV: 90 fl (ref 78.0–100.0)
Monocytes Absolute: 0.3 10*3/uL (ref 0.1–1.0)
Monocytes Relative: 4.3 % (ref 3.0–12.0)
Neutro Abs: 3.8 10*3/uL (ref 1.4–7.7)
Neutrophils Relative %: 52.2 % (ref 43.0–77.0)
Platelets: 256 10*3/uL (ref 150.0–400.0)
RBC: 4.33 Mil/uL (ref 3.87–5.11)
RDW: 12.7 % (ref 11.5–15.5)
WBC: 7.2 10*3/uL (ref 4.0–10.5)

## 2017-01-04 LAB — LIPID PANEL
Cholesterol: 184 mg/dL (ref 0–200)
HDL: 47.8 mg/dL (ref >39.00)
LDL Cholesterol: 110 mg/dL — ABNORMAL HIGH (ref 0–99)
NonHDL: 136.04
Total CHOL/HDL Ratio: 4
Triglycerides: 131 mg/dL (ref 0.0–149.0)
VLDL: 26.2 mg/dL (ref 0.0–40.0)

## 2017-01-04 LAB — TSH: TSH: 0.87 u[IU]/mL (ref 0.35–4.50)

## 2017-01-05 LAB — VITAMIN D 25 HYDROXY (VIT D DEFICIENCY, FRACTURES): VITD: 32.54 ng/mL (ref 30.00–100.00)

## 2017-01-07 ENCOUNTER — Ambulatory Visit (INDEPENDENT_AMBULATORY_CARE_PROVIDER_SITE_OTHER): Payer: 59 | Admitting: Internal Medicine

## 2017-01-07 ENCOUNTER — Encounter: Payer: Self-pay | Admitting: Internal Medicine

## 2017-01-07 VITALS — BP 110/68 | HR 59 | Temp 98.3°F | Resp 16 | Ht 62.0 in | Wt 165.2 lb

## 2017-01-07 DIAGNOSIS — Z1231 Encounter for screening mammogram for malignant neoplasm of breast: Secondary | ICD-10-CM

## 2017-01-07 DIAGNOSIS — Z1239 Encounter for other screening for malignant neoplasm of breast: Secondary | ICD-10-CM

## 2017-01-07 DIAGNOSIS — E782 Mixed hyperlipidemia: Secondary | ICD-10-CM

## 2017-01-07 DIAGNOSIS — Z0000 Encounter for general adult medical examination without abnormal findings: Secondary | ICD-10-CM | POA: Diagnosis not present

## 2017-01-07 MED ORDER — PREDNISONE 10 MG PO TABS: ORAL_TABLET | ORAL | 0 refills | Status: DC

## 2017-01-07 MED ORDER — BENZONATATE 200 MG PO CAPS: 200.0000 mg | ORAL_CAPSULE | Freq: Two times a day (BID) | ORAL | 0 refills | Status: DC | PRN

## 2017-01-07 MED ORDER — ESTROGENS, CONJUGATED 0.625 MG/GM VA CREA: 1.0000 | TOPICAL_CREAM | VAGINAL | 11 refills | Status: DC

## 2017-01-07 NOTE — Patient Instructions (Addendum)
Your labs are excellent!  You have lowered your  Cholesterol  Through diet and exercise    Health Maintenance for Postmenopausal Women Menopause is a normal process in which your reproductive ability comes to an end. This process happens gradually over a span of months to years, usually between the ages of 52 and 26. Menopause is complete when you have missed 12 consecutive menstrual periods. It is important to talk with your health care provider about some of the most common conditions that affect postmenopausal women, such as heart disease, cancer, and bone loss (osteoporosis). Adopting a healthy lifestyle and getting preventive care can help to promote your health and wellness. Those actions can also lower your chances of developing some of these common conditions. What should I know about menopause? During menopause, you may experience a number of symptoms, such as:  Moderate-to-severe hot flashes.  Night sweats.  Decrease in sex drive.  Mood swings.  Headaches.  Tiredness.  Irritability.  Memory problems.  Insomnia. Choosing to treat or not to treat menopausal changes is an individual decision that you make with your health care provider. What should I know about hormone replacement therapy and supplements? Hormone therapy products are effective for treating symptoms that are associated with menopause, such as hot flashes and night sweats. Hormone replacement carries certain risks, especially as you become older. If you are thinking about using estrogen or estrogen with progestin treatments, discuss the benefits and risks with your health care provider. What should I know about heart disease and stroke? Heart disease, heart attack, and stroke become more likely as you age. This may be due, in part, to the hormonal changes that your body experiences during menopause. These can affect how your body processes dietary fats, triglycerides, and cholesterol. Heart attack and stroke are  both medical emergencies. There are many things that you can do to help prevent heart disease and stroke:  Have your blood pressure checked at least every 1-2 years. High blood pressure causes heart disease and increases the risk of stroke.  If you are 37-77 years old, ask your health care provider if you should take aspirin to prevent a heart attack or a stroke.  Do not use any tobacco products, including cigarettes, chewing tobacco, or electronic cigarettes. If you need help quitting, ask your health care provider.  It is important to eat a healthy diet and maintain a healthy weight.  Be sure to include plenty of vegetables, fruits, low-fat dairy products, and lean protein.  Avoid eating foods that are high in solid fats, added sugars, or salt (sodium).  Get regular exercise. This is one of the most important things that you can do for your health.  Try to exercise for at least 150 minutes each week. The type of exercise that you do should increase your heart rate and make you sweat. This is known as moderate-intensity exercise.  Try to do strengthening exercises at least twice each week. Do these in addition to the moderate-intensity exercise.  Know your numbers.Ask your health care provider to check your cholesterol and your blood glucose. Continue to have your blood tested as directed by your health care provider. What should I know about cancer screening? There are several types of cancer. Take the following steps to reduce your risk and to catch any cancer development as early as possible. Breast Cancer  Practice breast self-awareness.  This means understanding how your breasts normally appear and feel.  It also means doing regular breast self-exams. Let  your health care provider know about any changes, no matter how small.  If you are 40 or older, have a clinician do a breast exam (clinical breast exam or CBE) every year. Depending on your age, family history, and medical  history, it may be recommended that you also have a yearly breast X-ray (mammogram).  If you have a family history of breast cancer, talk with your health care provider about genetic screening.  If you are at high risk for breast cancer, talk with your health care provider about having an MRI and a mammogram every year.  Breast cancer (BRCA) gene test is recommended for women who have family members with BRCA-related cancers. Results of the assessment will determine the need for genetic counseling and BRCA1 and for BRCA2 testing. BRCA-related cancers include these types:  Breast. This occurs in males or females.  Ovarian.  Tubal. This may also be called fallopian tube cancer.  Cancer of the abdominal or pelvic lining (peritoneal cancer).  Prostate.  Pancreatic. Cervical, Uterine, and Ovarian Cancer  Your health care provider may recommend that you be screened regularly for cancer of the pelvic organs. These include your ovaries, uterus, and vagina. This screening involves a pelvic exam, which includes checking for microscopic changes to the surface of your cervix (Pap test).  For women ages 21-65, health care providers may recommend a pelvic exam and a Pap test every three years. For women ages 65-65, they may recommend the Pap test and pelvic exam, combined with testing for human papilloma virus (HPV), every five years. Some types of HPV increase your risk of cervical cancer. Testing for HPV may also be done on women of any age who have unclear Pap test results.  Other health care providers may not recommend any screening for nonpregnant women who are considered low risk for pelvic cancer and have no symptoms. Ask your health care provider if a screening pelvic exam is right for you.  If you have had past treatment for cervical cancer or a condition that could lead to cancer, you need Pap tests and screening for cancer for at least 20 years after your treatment. If Pap tests have been  discontinued for you, your risk factors (such as having a new sexual partner) need to be reassessed to determine if you should start having screenings again. Some women have medical problems that increase the chance of getting cervical cancer. In these cases, your health care provider may recommend that you have screening and Pap tests more often.  If you have a family history of uterine cancer or ovarian cancer, talk with your health care provider about genetic screening.  If you have vaginal bleeding after reaching menopause, tell your health care provider.  There are currently no reliable tests available to screen for ovarian cancer. Lung Cancer  Lung cancer screening is recommended for adults 66-87 years old who are at high risk for lung cancer because of a history of smoking. A yearly low-dose CT scan of the lungs is recommended if you:  Currently smoke.  Have a history of at least 30 pack-years of smoking and you currently smoke or have quit within the past 15 years. A pack-year is smoking an average of one pack of cigarettes per day for one year. Yearly screening should:  Continue until it has been 15 years since you quit.  Stop if you develop a health problem that would prevent you from having lung cancer treatment. Colorectal Cancer  This type of cancer can  be detected and can often be prevented.  Routine colorectal cancer screening usually begins at age 85 and continues through age 84.  If you have risk factors for colon cancer, your health care provider may recommend that you be screened at an earlier age.  If you have a family history of colorectal cancer, talk with your health care provider about genetic screening.  Your health care provider may also recommend using home test kits to check for hidden blood in your stool.  A small camera at the end of a tube can be used to examine your colon directly (sigmoidoscopy or colonoscopy). This is done to check for the earliest  forms of colorectal cancer.  Direct examination of the colon should be repeated every 5-10 years until age 99. However, if early forms of precancerous polyps or small growths are found or if you have a family history or genetic risk for colorectal cancer, you may need to be screened more often. Skin Cancer  Check your skin from head to toe regularly.  Monitor any moles. Be sure to tell your health care provider:  About any new moles or changes in moles, especially if there is a change in a mole's shape or color.  If you have a mole that is larger than the size of a pencil eraser.  If any of your family members has a history of skin cancer, especially at a young age, talk with your health care provider about genetic screening.  Always use sunscreen. Apply sunscreen liberally and repeatedly throughout the day.  Whenever you are outside, protect yourself by wearing long sleeves, pants, a wide-brimmed hat, and sunglasses. What should I know about osteoporosis? Osteoporosis is a condition in which bone destruction happens more quickly than new bone creation. After menopause, you may be at an increased risk for osteoporosis. To help prevent osteoporosis or the bone fractures that can happen because of osteoporosis, the following is recommended:  If you are 102-62 years old, get at least 1,000 mg of calcium and at least 600 mg of vitamin D per day.  If you are older than age 79 but younger than age 29, get at least 1,200 mg of calcium and at least 600 mg of vitamin D per day.  If you are older than age 58, get at least 1,200 mg of calcium and at least 800 mg of vitamin D per day. Smoking and excessive alcohol intake increase the risk of osteoporosis. Eat foods that are rich in calcium and vitamin D, and do weight-bearing exercises several times each week as directed by your health care provider. What should I know about how menopause affects my mental health? Depression may occur at any age, but  it is more common as you become older. Common symptoms of depression include:  Low or sad mood.  Changes in sleep patterns.  Changes in appetite or eating patterns.  Feeling an overall lack of motivation or enjoyment of activities that you previously enjoyed.  Frequent crying spells. Talk with your health care provider if you think that you are experiencing depression. What should I know about immunizations? It is important that you get and maintain your immunizations. These include:  Tetanus, diphtheria, and pertussis (Tdap) booster vaccine.  Influenza every year before the flu season begins.  Pneumonia vaccine.  Shingles vaccine. Your health care provider may also recommend other immunizations. This information is not intended to replace advice given to you by your health care provider. Make sure you discuss any questions  you have with your health care provider. Document Released: 10/02/2005 Document Revised: 02/28/2016 Document Reviewed: 05/14/2015 Elsevier Interactive Patient Education  2017 [employer].

## 2017-01-07 NOTE — Progress Notes (Signed)
Patient ID: Anne Ingram, female    DOB: [birth year]  Age: 61 y.o. MRN: [id number]  The patient is here for annual preventive  examination and management of other chronic and acute problems.   Last seen march 2017 colonosocopy done by [facility]  2013 Dermatology   [facility] removed May 2017    The risk factors are reflected in the social history.   The roster of all physicians providing medical care to patient - is listed in the Snapshot section of the chart.  Activities of daily living:  The patient is 100% independent in all ADLs: dressing, toileting, feeding as well as independent mobility  Home safety : The patient has smoke detectors in the home. They wear seatbelts.  There are no firearms at home. There is no violence in the home.   There is no risks for hepatitis, STDs or HIV. There is no   history of blood transfusion. They have no travel history to infectious disease endemic areas of the world.  The patient has seen their dentist in the last six month. They have seen their eye doctor in the last year. They admit to slight hearing difficulty with regard to whispered voices and some television programs.  They have deferred audiologic testing in the last year.  They do not  have excessive sun exposure. Discussed the need for sun protection: hats, long sleeves and use of sunscreen if there is significant sun exposure.   Diet: the importance of a healthy diet is discussed. They do have a healthy diet.  The benefits of regular aerobic exercise were discussed.   Depression screen: there are no signs or vegative symptoms of depression- irritability, change in appetite, anhedonia, sadness/tearfullness.  Cognitive assessment: the patient manages all their financial and personal affairs and is actively engaged. They could relate day,date,year and events; recalled 2/3 objects at 3 minutes; performed clock-face test normally.  The following portions of the patient's history were reviewed and updated  as appropriate: allergies, current medications, past family history, past medical history,  past surgical history, past social history  and problem list.  Visual acuity was not assessed per patient preference since she has regular follow up with her ophthalmologist. Hearing and body mass index were assessed and reviewed.   During the course of the visit the patient was educated and counseled about appropriate screening and preventive services including : fall prevention , diabetes screening, nutrition counseling, colorectal cancer screening, and recommended immunizations.    CC: The primary encounter diagnosis was Screening breast examination. Diagnoses of Mixed hyperlipidemia and Encounter for preventive health examination were also pertinent to this visit.  Prolonged cough and hoarseness:  She was treated by Joycelyn Schmid April 30 . Eventually started a course of doxycycline when a  short prednisone course did not resolve her cough and she had run out of tussionex .     Taking dilt for arrhythmia, did not tolerate suspension of both meds so continuing diltiazem only.      History Keylani has a past medical history of Cardiac arrhythmia due to congenital heart disease; Chicken pox; Elevated blood pressure reading; and Hypertension.   She has no past surgical history on file.   Her family history includes Hypertension in her mother; Mental illness in her mother.She reports that she has quit smoking. She has never used smokeless tobacco. She reports that she drinks about 2.5 oz of alcohol per week . She reports that she does not use drugs.  Outpatient Medications Prior to Visit  Medication Sig Dispense Refill  . albuterol (PROVENTIL HFA) 108 (90 Base) MCG/ACT inhaler Inhale 2 puffs into the lungs every 6 (six) hours as needed for wheezing or shortness of breath. 1 Inhaler 1  . aspirin 81 MG tablet Take 81 mg by mouth daily. Reported on 10/23/2015    . diltiazem (CARTIA XT) 120 MG 24 hr capsule Take 1  capsule (120 mg total) by mouth daily. 90 capsule 0  . metoprolol succinate (TOPROL-XL) 50 MG 24 hr tablet Take 1 tablet (50 mg total) by mouth daily. Take with or immediately following a meal. 90 tablet 0  . PREMARIN vaginal cream PLACE 1 APPLICATORFUL VAGINALLY DAILY. 30 g 0  . Cholecalciferol-Vitamin C (VITAMIN D3-VITAMIN C) 1000-500 UNIT-MG CAPS Take by mouth.    . doxycycline (VIBRA-TABS) 100 MG tablet Take 1 tablet (100 mg total) by mouth 2 (two) times daily. (Patient not taking: Reported on 01/07/2017) 10 tablet 0  . HYDROcodone-homatropine (HYCODAN) 5-1.5 MG/5ML syrup Take 5 mLs by mouth at bedtime as needed for cough. (Patient not taking: Reported on 01/07/2017) 30 mL 0  . predniSONE (DELTASONE) 10 MG tablet Take 40 mg by mouth on day 1, then taper 10 mg daily until gone (Patient not taking: Reported on 01/07/2017) 10 tablet 0   No facility-administered medications prior to visit.     Review of Systems   Patient denies headache, fevers, malaise, unintentional weight loss, skin rash, eye pain, sinus congestion and sinus pain, sore throat, dysphagia,  hemoptysis ,, dyspnea, wheezing, chest pain, palpitations, orthopnea, edema, abdominal pain, nausea, melena, diarrhea, constipation, flank pain, dysuria, hematuria, urinary  Frequency, nocturia, numbness, tingling, seizures,  Focal weakness, Loss of consciousness,  Tremor, insomnia, depression, anxiety, and suicidal ideation.      Objective:  BP 110/68 (BP Location: Left Arm, Patient Position: Sitting, Cuff Size: Normal)   Pulse (!) 59   Temp 98.3 F (36.8 C) (Oral)   Resp 16   Ht 5\' 2"  (1.575 m)   Wt 165 lb 3.2 oz (74.9 kg)   SpO2 99%   BMI 30.22 kg/m   Physical Exam   General appearance: alert, cooperative and appears stated age Head: Normocephalic, without obvious abnormality, atraumatic Eyes: conjunctivae/corneas clear. PERRL, EOM's intact. Fundi benign. Ears: normal TM's and external ear canals both ears Nose: Nares normal.  Septum midline. Mucosa normal. No drainage or sinus tenderness. Throat: lips, mucosa, and tongue normal; teeth and gums normal Neck: no adenopathy, no carotid bruit, no JVD, supple, symmetrical, trachea midline and thyroid not enlarged, symmetric, no tenderness/mass/nodules Lungs: clear to auscultation bilaterally Breasts: normal appearance, no masses or tenderness Heart: regular rate and rhythm, S1, S2 normal, no murmur, click, rub or gallop Abdomen: soft, non-tender; bowel sounds normal; no masses,  no organomegaly Extremities: extremities normal, atraumatic, no cyanosis or edema Pulses: 2+ and symmetric Skin: Skin color, texture, turgor normal. No rashes or lesions Neurologic: Alert and oriented X 3, normal strength and tone. Normal symmetric reflexes. Normal coordination and gait.      Assessment & Plan:   Problem List Items Addressed This Visit    Hyperlipidemia    Improved with low GI diet , exercise and weight loss, no need for medications.  Lab Results  Component Value Date   CHOL 184 01/04/2017   HDL 47.80 01/04/2017   LDLCALC 110 (H) 01/04/2017   LDLDIRECT 135.0 10/23/2015   TRIG 131.0 01/04/2017   CHOLHDL 4 01/04/2017         Encounter for preventive health examination  Annual comprehensive preventive exam was done as well as an evaluation and management of chronic conditions .  During the course of the visit the patient was educated and counseled about appropriate screening and preventive services including :  diabetes screening, lipid analysis with projected  10 year  risk for CAD , nutrition counseling, breast, cervical and colorectal cancer screening, and recommended immunizations.  Printed recommendations for health maintenance screenings was given       Other Visit Diagnoses    Screening breast examination    -  Primary   Relevant Orders   MM SCREENING BREAST TOMO BILATERAL      I have discontinued Ms. Gravley's Vitamin D3-Vitamin C, predniSONE,  doxycycline, and HYDROcodone-homatropine. I have also changed her PREMARIN to conjugated estrogens. Additionally, I am having her start on benzonatate and predniSONE. Lastly, I am having her maintain her aspirin, metoprolol succinate, diltiazem, albuterol, and Calcium Carbonate-Vitamin D.  Meds ordered this encounter  Medications  . Calcium Carbonate-Vitamin D (CALTRATE 600+D) 600-400 MG-UNIT tablet    Sig: Take 1 tablet by mouth daily.  . benzonatate (TESSALON) 200 MG capsule    Sig: Take 1 capsule (200 mg total) by mouth 2 (two) times daily as needed for cough.    Dispense:  60 capsule    Refill:  0  . predniSONE (DELTASONE) 10 MG tablet    Sig: 6 tablets on Day 1 , then reduce by 1 tablet daily until gone    Dispense:  21 tablet    Refill:  0  . conjugated estrogens (PREMARIN) vaginal cream    Sig: Place 1 Applicatorful vaginally 2 (two) times a week.    Dispense:  30 g    Refill:  11    Medications Discontinued During This Encounter  Medication Reason  . Cholecalciferol-Vitamin C (VITAMIN D3-VITAMIN C) 1000-500 UNIT-MG CAPS Patient has not taken in last 30 days  . doxycycline (VIBRA-TABS) 100 MG tablet Therapy completed  . HYDROcodone-homatropine (HYCODAN) 5-1.5 MG/5ML syrup Therapy completed  . predniSONE (DELTASONE) 10 MG tablet Therapy completed  . PREMARIN vaginal cream Reorder    Follow-up: No Follow-up on file.   Crecencio Mc, MD

## 2017-01-09 DIAGNOSIS — E785 Hyperlipidemia, unspecified: Secondary | ICD-10-CM | POA: Insufficient documentation

## 2017-01-09 NOTE — Assessment & Plan Note (Signed)
Improved with low GI diet , exercise and weight loss, no need for medications.  Lab Results  Component Value Date   CHOL 184 01/04/2017   HDL 47.80 01/04/2017   LDLCALC 110 (H) 01/04/2017   LDLDIRECT 135.0 10/23/2015   TRIG 131.0 01/04/2017   CHOLHDL 4 01/04/2017

## 2017-01-09 NOTE — Assessment & Plan Note (Signed)
Annual comprehensive preventive exam was done as well as an evaluation and management of chronic conditions .  During the course of the visit the patient was educated and counseled about appropriate screening and preventive services including :  diabetes screening, lipid analysis with projected  10 year  risk for CAD , nutrition counseling, breast, cervical and colorectal cancer screening, and recommended immunizations.  Printed recommendations for health maintenance screenings was given 

## 2017-02-08 ENCOUNTER — Ambulatory Visit
Admission: RE | Admit: 2017-02-08 | Discharge: 2017-02-08 | Disposition: A | Discharge status: Home / Self Care | Payer: 59 | Source: Ambulatory Visit | Attending: Internal Medicine | Admitting: Internal Medicine

## 2017-02-08 DIAGNOSIS — Z1239 Encounter for other screening for malignant neoplasm of breast: Secondary | ICD-10-CM

## 2017-02-08 DIAGNOSIS — Z1231 Encounter for screening mammogram for malignant neoplasm of breast: Secondary | ICD-10-CM | POA: Insufficient documentation

## 2017-02-16 ENCOUNTER — Other Ambulatory Visit: Payer: Self-pay | Admitting: Internal Medicine

## 2017-03-01 ENCOUNTER — Other Ambulatory Visit: Payer: Self-pay | Admitting: Internal Medicine

## 2017-03-01 DIAGNOSIS — I498 Other specified cardiac arrhythmias: Secondary | ICD-10-CM

## 2017-04-12 DIAGNOSIS — Z85828 Personal history of other malignant neoplasm of skin: Secondary | ICD-10-CM | POA: Diagnosis not present

## 2017-04-12 DIAGNOSIS — L249 Irritant contact dermatitis, unspecified cause: Secondary | ICD-10-CM | POA: Diagnosis not present

## 2017-04-12 DIAGNOSIS — L718 Other rosacea: Secondary | ICD-10-CM | POA: Diagnosis not present

## 2017-04-12 DIAGNOSIS — L578 Other skin changes due to chronic exposure to nonionizing radiation: Secondary | ICD-10-CM | POA: Diagnosis not present

## 2017-04-12 DIAGNOSIS — D229 Melanocytic nevi, unspecified: Secondary | ICD-10-CM | POA: Diagnosis not present

## 2017-04-12 DIAGNOSIS — L304 Erythema intertrigo: Secondary | ICD-10-CM | POA: Diagnosis not present

## 2017-04-12 DIAGNOSIS — D1800 Hemangioma unspecified site: Secondary | ICD-10-CM | POA: Diagnosis not present

## 2017-04-12 DIAGNOSIS — Z1283 Encounter for screening for malignant neoplasm of skin: Secondary | ICD-10-CM | POA: Diagnosis not present

## 2017-04-12 DIAGNOSIS — L814 Other melanin hyperpigmentation: Secondary | ICD-10-CM | POA: Diagnosis not present

## 2017-05-25 ENCOUNTER — Other Ambulatory Visit: Payer: Self-pay | Admitting: Internal Medicine

## 2017-05-31 ENCOUNTER — Other Ambulatory Visit: Payer: Self-pay | Admitting: Internal Medicine

## 2017-05-31 DIAGNOSIS — I498 Other specified cardiac arrhythmias: Secondary | ICD-10-CM

## 2017-07-06 ENCOUNTER — Ambulatory Visit (INDEPENDENT_AMBULATORY_CARE_PROVIDER_SITE_OTHER): Payer: 59

## 2017-07-06 DIAGNOSIS — Z23 Encounter for immunization: Secondary | ICD-10-CM

## 2017-07-06 NOTE — Progress Notes (Signed)
Patient comes in today for influenza injection. Administered injection in left deltoid IM. Patient tolerated well.

## 2017-08-19 ENCOUNTER — Other Ambulatory Visit: Payer: Self-pay | Admitting: Internal Medicine

## 2017-08-25 ENCOUNTER — Other Ambulatory Visit: Payer: Self-pay | Admitting: Internal Medicine

## 2017-08-25 DIAGNOSIS — I498 Other specified cardiac arrhythmias: Secondary | ICD-10-CM

## 2017-10-19 ENCOUNTER — Ambulatory Visit: Payer: Self-pay | Admitting: Family Medicine

## 2017-10-19 ENCOUNTER — Encounter: Payer: Self-pay | Admitting: Internal Medicine

## 2017-10-19 VITALS — BP 149/78 | HR 79 | Temp 99.7°F | Wt 160.0 lb

## 2017-10-19 DIAGNOSIS — L03211 Cellulitis of face: Secondary | ICD-10-CM

## 2017-10-19 MED ORDER — SULFAMETHOXAZOLE-TRIMETHOPRIM 800-160 MG PO TABS: 1.0000 | ORAL_TABLET | Freq: Two times a day (BID) | ORAL | 0 refills | Status: DC

## 2017-10-19 NOTE — Progress Notes (Signed)
Anne Ingram is a 62 y/o female who is here with complaint of right sided neck pain that has progressively expanded into her cheek over the last 3 days. She denies any known causes or trauma but does report chronic condition of rosacea. She report pain and tenderness to touch of neck and face only and pain does not extend to the inside of her mouth. She reports some dental work pending but no procedures that are emergent at this time.   Review of Systems  Constitutional: Positive for fever and malaise/fatigue. Negative for chills, diaphoresis and weight loss.  HENT: Negative for ear discharge, ear pain, hearing loss and tinnitus.   Eyes: Negative for blurred vision, double vision, photophobia and pain.  Respiratory: Negative for cough, hemoptysis and sputum production.   Cardiovascular: Negative for chest pain, palpitations and orthopnea.  Gastrointestinal: Negative.   Genitourinary: Negative.   Musculoskeletal: Positive for myalgias and neck pain.  Skin: Positive for rash. Negative for itching.  Neurological: Negative.  Negative for weakness.  Endo/Heme/Allergies: Negative.    Physical Exam  Constitutional: Vital signs are normal. She appears well-developed and well-nourished.  HENT:  Head: Normocephalic.  Right Ear: Hearing, tympanic membrane, external ear and ear canal normal.  Left Ear: Hearing, tympanic membrane, external ear and ear canal normal.  Nose: Nose normal. No mucosal edema or rhinorrhea.  Mouth/Throat: Uvula is midline and oropharynx is clear and moist. No oropharyngeal exudate.  No evidence of oral tonsillar abscess or oral infection upon exam  Eyes: Pupils are equal, round, and reactive to light.  Neck: No JVD present. No tracheal deviation present. No thyromegaly present.  Cardiovascular: Normal rate and regular rhythm.  Pulmonary/Chest: Effort normal and breath sounds normal.  Skin: Skin is warm and dry. Rash noted. There is erythema. No pallor.     Area is warm, erythemic  and non fluctuant to touch- patient reports pain with palpation- no evidence of rash present evidence of chronic condition of rosacea and mild dryness and flaking of skin on cheek present. Redness is more pronounced on the right side of the face which is the affected side   A:  1. Cellulitis diffuse, face     P:  1. Cellulitis diffuse, face - sulfamethoxazole-trimethoprim (BACTRIM DS,SEPTRA DS) 800-160 MG tablet; Take 1 tablet by mouth 2 (two) times daily.  Motrin 600 mg every 8 hours-  Follow up with PCP for  Additional work up and treatment- dicussed the potential need for additional more invasive care not available at this location. Patient verbalized understanding, agrees with POC- all questions and concerns addressed.

## 2017-10-19 NOTE — Telephone Encounter (Signed)
Right side of underneath jaw line a swollen gland, cheek swollen red and itches. Very warm to touch to patient. Advised patient red warm to touch she needs to be evaluated today no appointments available advised needs to be seen at [facility] patient stated she would go to [place] walk in.

## 2017-10-21 ENCOUNTER — Telehealth: Payer: Self-pay | Admitting: Emergency Medicine

## 2017-10-21 NOTE — Telephone Encounter (Signed)
Left message. Follow up call on patient.

## 2017-10-26 DIAGNOSIS — L718 Other rosacea: Secondary | ICD-10-CM | POA: Diagnosis not present

## 2017-10-26 DIAGNOSIS — Z85828 Personal history of other malignant neoplasm of skin: Secondary | ICD-10-CM | POA: Diagnosis not present

## 2017-10-26 DIAGNOSIS — L739 Follicular disorder, unspecified: Secondary | ICD-10-CM | POA: Diagnosis not present

## 2017-10-26 DIAGNOSIS — D229 Melanocytic nevi, unspecified: Secondary | ICD-10-CM | POA: Diagnosis not present

## 2017-10-26 DIAGNOSIS — L249 Irritant contact dermatitis, unspecified cause: Secondary | ICD-10-CM | POA: Diagnosis not present

## 2017-10-26 DIAGNOSIS — L814 Other melanin hyperpigmentation: Secondary | ICD-10-CM | POA: Diagnosis not present

## 2017-10-26 DIAGNOSIS — Z1283 Encounter for screening for malignant neoplasm of skin: Secondary | ICD-10-CM | POA: Diagnosis not present

## 2017-10-26 DIAGNOSIS — L918 Other hypertrophic disorders of the skin: Secondary | ICD-10-CM | POA: Diagnosis not present

## 2017-10-26 DIAGNOSIS — L304 Erythema intertrigo: Secondary | ICD-10-CM | POA: Diagnosis not present

## 2017-11-18 ENCOUNTER — Other Ambulatory Visit: Payer: Self-pay | Admitting: Internal Medicine

## 2018-02-07 ENCOUNTER — Other Ambulatory Visit: Payer: Self-pay | Admitting: Internal Medicine

## 2018-02-07 DIAGNOSIS — I498 Other specified cardiac arrhythmias: Secondary | ICD-10-CM

## 2018-03-14 ENCOUNTER — Encounter: Payer: 59 | Admitting: Internal Medicine

## 2018-03-15 ENCOUNTER — Encounter: Payer: Self-pay | Admitting: Internal Medicine

## 2018-03-15 ENCOUNTER — Ambulatory Visit (INDEPENDENT_AMBULATORY_CARE_PROVIDER_SITE_OTHER): Payer: 59 | Admitting: Internal Medicine

## 2018-03-15 VITALS — BP 116/70 | HR 60 | Temp 98.6°F | Resp 14 | Ht 61.75 in | Wt 166.6 lb

## 2018-03-15 DIAGNOSIS — R7301 Impaired fasting glucose: Secondary | ICD-10-CM

## 2018-03-15 DIAGNOSIS — I498 Other specified cardiac arrhythmias: Secondary | ICD-10-CM

## 2018-03-15 DIAGNOSIS — E782 Mixed hyperlipidemia: Secondary | ICD-10-CM | POA: Diagnosis not present

## 2018-03-15 DIAGNOSIS — R7303 Prediabetes: Secondary | ICD-10-CM | POA: Diagnosis not present

## 2018-03-15 DIAGNOSIS — E559 Vitamin D deficiency, unspecified: Secondary | ICD-10-CM

## 2018-03-15 DIAGNOSIS — Z0000 Encounter for general adult medical examination without abnormal findings: Secondary | ICD-10-CM

## 2018-03-15 LAB — LIPID PANEL
Cholesterol: 197 mg/dL (ref 0–200)
HDL: 52.5 mg/dL (ref >39.00)
LDL Cholesterol: 116 mg/dL — ABNORMAL HIGH (ref 0–99)
NonHDL: 144.19
Total CHOL/HDL Ratio: 4
Triglycerides: 140 mg/dL (ref 0.0–149.0)
VLDL: 28 mg/dL (ref 0.0–40.0)

## 2018-03-15 LAB — COMPREHENSIVE METABOLIC PANEL
ALT: 17 U/L (ref 0–35)
AST: 18 U/L (ref 0–37)
Albumin: 4.3 g/dL (ref 3.5–5.2)
Alkaline Phosphatase: 51 U/L (ref 39–117)
BUN: 15 mg/dL (ref 6–23)
CO2: 28 mEq/L (ref 19–32)
Calcium: 9.7 mg/dL (ref 8.4–10.5)
Chloride: 103 mEq/L (ref 96–112)
Creatinine, Ser: 0.84 mg/dL (ref 0.40–1.20)
GFR: 73.13 mL/min (ref >60.00)
Glucose, Bld: 100 mg/dL — ABNORMAL HIGH (ref 70–99)
Potassium: 4 mEq/L (ref 3.5–5.1)
Sodium: 139 mEq/L (ref 135–145)
Total Bilirubin: 0.4 mg/dL (ref 0.2–1.2)
Total Protein: 7.3 g/dL (ref 6.0–8.3)

## 2018-03-15 LAB — VITAMIN D 25 HYDROXY (VIT D DEFICIENCY, FRACTURES): VITD: 32.26 ng/mL (ref 30.00–100.00)

## 2018-03-15 LAB — HEMOGLOBIN A1C: Hgb A1c MFr Bld: 6 % (ref 4.6–6.5)

## 2018-03-15 LAB — TSH: TSH: 1.34 u[IU]/mL (ref 0.35–4.50)

## 2018-03-15 MED ORDER — DILTIAZEM HCL ER COATED BEADS 120 MG PO CP24: ORAL_CAPSULE | ORAL | 1 refills | Status: DC

## 2018-03-15 MED ORDER — ESTROGENS, CONJUGATED 0.625 MG/GM VA CREA: TOPICAL_CREAM | VAGINAL | 11 refills | Status: DC

## 2018-03-15 MED ORDER — METOPROLOL SUCCINATE ER 50 MG PO TB24: ORAL_TABLET | ORAL | 1 refills | Status: DC

## 2018-03-15 MED ORDER — ZOSTER VAC RECOMB ADJUVANTED 50 MCG/0.5ML IM SUSR: 0.5000 mL | Freq: Once | INTRAMUSCULAR | 1 refills | Status: AC

## 2018-03-15 MED ORDER — DOXYCYCLINE HYCLATE 100 MG PO TABS: 100.0000 mg | ORAL_TABLET | Freq: Two times a day (BID) | ORAL | 0 refills | Status: DC

## 2018-03-15 NOTE — Assessment & Plan Note (Signed)
Her random glucose is again elevated but not diagnostic of diabetes .  I recommend he follow a low glycemic index diet and particpate regularly in an aerobic  exercise activity.  We should check an A1c in 6 months.

## 2018-03-15 NOTE — Patient Instructions (Addendum)
Save the doxycycline for a tick bite that looks infected or  Causes headaches,  Body aches,  Fevers  Or malaise  Take with a full meal   Taking an antibiotic can create an imbalance in the normal population of bacteria that live in the small intestine.  This imbalance can persist for 3 months.   Taking a probiotic ( Align, Floraque or Culturelle), the generic version of one of these over the counter medications, or an alternative form (kombucha,  Yogurt, or another dietary source) for a minimum of 3 weeks may help prevent a serious antibiotic associated diarrhea  Called clostridium dificile colitis that occurs when the bacteria population is altered .  Taking a probiotic may also prevent vaginitis due to yeast infections and can be continued indefinitely if you feel that it improves your digestion or your elimination (bowels).   .    The ShingRx vaccine is now available for you here and in local pharmacies and is much more protective thant Zostavaxs,  It is therefore ADVISED for all interested adults over 50 to prevent shingles    Health Maintenance for Postmenopausal Women Menopause is a normal process in which your reproductive ability comes to an end. This process happens gradually over a span of months to years, usually between the ages of 73 and 28. Menopause is complete when you have missed 12 consecutive menstrual periods. It is important to talk with your health care provider about some of the most common conditions that affect postmenopausal women, such as heart disease, cancer, and bone loss (osteoporosis). Adopting a healthy lifestyle and getting preventive care can help to promote your health and wellness. Those actions can also lower your chances of developing some of these common conditions. What should I know about menopause? During menopause, you may experience a number of symptoms, such as:  Moderate-to-severe hot flashes.  Night sweats.  Decrease in sex drive.  Mood  swings.  Headaches.  Tiredness.  Irritability.  Memory problems.  Insomnia.  Choosing to treat or not to treat menopausal changes is an individual decision that you make with your health care provider. What should I know about hormone replacement therapy and supplements? Hormone therapy products are effective for treating symptoms that are associated with menopause, such as hot flashes and night sweats. Hormone replacement carries certain risks, especially as you become older. If you are thinking about using estrogen or estrogen with progestin treatments, discuss the benefits and risks with your health care provider. What should I know about heart disease and stroke? Heart disease, heart attack, and stroke become more likely as you age. This may be due, in part, to the hormonal changes that your body experiences during menopause. These can affect how your body processes dietary fats, triglycerides, and cholesterol. Heart attack and stroke are both medical emergencies. There are many things that you can do to help prevent heart disease and stroke:  Have your blood pressure checked at least every 1-2 years. High blood pressure causes heart disease and increases the risk of stroke.  If you are 19-76 years old, ask your health care provider if you should take aspirin to prevent a heart attack or a stroke.  Do not use any tobacco products, including cigarettes, chewing tobacco, or electronic cigarettes. If you need help quitting, ask your health care provider.  It is important to eat a healthy diet and maintain a healthy weight. ? Be sure to include plenty of vegetables, fruits, low-fat dairy products, and lean protein. ?  Avoid eating foods that are high in solid fats, added sugars, or salt (sodium).  Get regular exercise. This is one of the most important things that you can do for your health. ? Try to exercise for at least 150 minutes each week. The type of exercise that you do should  increase your heart rate and make you sweat. This is known as moderate-intensity exercise. ? Try to do strengthening exercises at least twice each week. Do these in addition to the moderate-intensity exercise.  Know your numbers.Ask your health care provider to check your cholesterol and your blood glucose. Continue to have your blood tested as directed by your health care provider.  What should I know about cancer screening? There are several types of cancer. Take the following steps to reduce your risk and to catch any cancer development as early as possible. Breast Cancer  Practice breast self-awareness. ? This means understanding how your breasts normally appear and feel. ? It also means doing regular breast self-exams. Let your health care provider know about any changes, no matter how small.  If you are 6 or older, have a clinician do a breast exam (clinical breast exam or CBE) every year. Depending on your age, family history, and medical history, it may be recommended that you also have a yearly breast X-ray (mammogram).  If you have a family history of breast cancer, talk with your health care provider about genetic screening.  If you are at high risk for breast cancer, talk with your health care provider about having an MRI and a mammogram every year.  Breast cancer (BRCA) gene test is recommended for women who have family members with BRCA-related cancers. Results of the assessment will determine the need for genetic counseling and BRCA1 and for BRCA2 testing. BRCA-related cancers include these types: ? Breast. This occurs in males or females. ? Ovarian. ? Tubal. This may also be called fallopian tube cancer. ? Cancer of the abdominal or pelvic lining (peritoneal cancer). ? Prostate. ? Pancreatic.  Cervical, Uterine, and Ovarian Cancer Your health care provider may recommend that you be screened regularly for cancer of the pelvic organs. These include your ovaries, uterus,  and vagina. This screening involves a pelvic exam, which includes checking for microscopic changes to the surface of your cervix (Pap test).  For women ages 21-65, health care providers may recommend a pelvic exam and a Pap test every three years. For women ages 61-65, they may recommend the Pap test and pelvic exam, combined with testing for human papilloma virus (HPV), every five years. Some types of HPV increase your risk of cervical cancer. Testing for HPV may also be done on women of any age who have unclear Pap test results.  Other health care providers may not recommend any screening for nonpregnant women who are considered low risk for pelvic cancer and have no symptoms. Ask your health care provider if a screening pelvic exam is right for you.  If you have had past treatment for cervical cancer or a condition that could lead to cancer, you need Pap tests and screening for cancer for at least 20 years after your treatment. If Pap tests have been discontinued for you, your risk factors (such as having a new sexual partner) need to be reassessed to determine if you should start having screenings again. Some women have medical problems that increase the chance of getting cervical cancer. In these cases, your health care provider may recommend that you have screening and Pap  tests more often.  If you have a family history of uterine cancer or ovarian cancer, talk with your health care provider about genetic screening.  If you have vaginal bleeding after reaching menopause, tell your health care provider.  There are currently no reliable tests available to screen for ovarian cancer.  Lung Cancer Lung cancer screening is recommended for adults 30-69 years old who are at high risk for lung cancer because of a history of smoking. A yearly low-dose CT scan of the lungs is recommended if you:  Currently smoke.  Have a history of at least 30 pack-years of smoking and you currently smoke or have quit  within the past 15 years. A pack-year is smoking an average of one pack of cigarettes per day for one year.  Yearly screening should:  Continue until it has been 15 years since you quit.  Stop if you develop a health problem that would prevent you from having lung cancer treatment.  Colorectal Cancer  This type of cancer can be detected and can often be prevented.  Routine colorectal cancer screening usually begins at age 72 and continues through age 39.  If you have risk factors for colon cancer, your health care provider may recommend that you be screened at an earlier age.  If you have a family history of colorectal cancer, talk with your health care provider about genetic screening.  Your health care provider may also recommend using home test kits to check for hidden blood in your stool.  A small camera at the end of a tube can be used to examine your colon directly (sigmoidoscopy or colonoscopy). This is done to check for the earliest forms of colorectal cancer.  Direct examination of the colon should be repeated every 5-10 years until age 65. However, if early forms of precancerous polyps or small growths are found or if you have a family history or genetic risk for colorectal cancer, you may need to be screened more often.  Skin Cancer  Check your skin from head to toe regularly.  Monitor any moles. Be sure to tell your health care provider: ? About any new moles or changes in moles, especially if there is a change in a mole's shape or color. ? If you have a mole that is larger than the size of a pencil eraser.  If any of your family members has a history of skin cancer, especially at a young age, talk with your health care provider about genetic screening.  Always use sunscreen. Apply sunscreen liberally and repeatedly throughout the day.  Whenever you are outside, protect yourself by wearing long sleeves, pants, a wide-brimmed hat, and sunglasses.  What should I know  about osteoporosis? Osteoporosis is a condition in which bone destruction happens more quickly than new bone creation. After menopause, you may be at an increased risk for osteoporosis. To help prevent osteoporosis or the bone fractures that can happen because of osteoporosis, the following is recommended:  If you are 39-81 years old, get at least 1,000 mg of calcium and at least 600 mg of vitamin D per day.  If you are older than age 39 but younger than age 71, get at least 1,200 mg of calcium and at least 600 mg of vitamin D per day.  If you are older than age 58, get at least 1,200 mg of calcium and at least 800 mg of vitamin D per day.  Smoking and excessive alcohol intake increase the risk of osteoporosis. Eat  foods that are rich in calcium and vitamin D, and do weight-bearing exercises several times each week as directed by your health care provider. What should I know about how menopause affects my mental health? Depression may occur at any age, but it is more common as you become older. Common symptoms of depression include:  Low or sad mood.  Changes in sleep patterns.  Changes in appetite or eating patterns.  Feeling an overall lack of motivation or enjoyment of activities that you previously enjoyed.  Frequent crying spells.  Talk with your health care provider if you think that you are experiencing depression. What should I know about immunizations? It is important that you get and maintain your immunizations. These include:  Tetanus, diphtheria, and pertussis (Tdap) booster vaccine.  Influenza every year before the flu season begins.  Pneumonia vaccine.  Shingles vaccine.  Your health care provider may also recommend other immunizations. This information is not intended to replace advice given to you by your health care provider. Make sure you discuss any questions you have with your health care provider. Document Released: 10/02/2005 Document Revised: 02/28/2016  Document Reviewed: 05/14/2015 Elsevier Interactive Patient Education  2018 [employer].

## 2018-03-15 NOTE — Assessment & Plan Note (Signed)
Annual comprehensive preventive exam was done as well as an evaluation and management of chronic conditions .  During the course of the visit the patient was educated and counseled about appropriate screening and preventive services including :  diabetes screening, lipid analysis with projected  10 year  risk for CAD , nutrition counseling, breast, cervical and colorectal cancer screening, and recommended immunizations.  Printed recommendations for health maintenance screenings was given.  Lab Results  Component Value Date   HGBA1C 6.0 03/15/2018   Lab Results  Component Value Date   CHOL 197 03/15/2018   HDL 52.50 03/15/2018   LDLCALC 116 (H) 03/15/2018   LDLDIRECT 135.0 10/23/2015   TRIG 140.0 03/15/2018   CHOLHDL 4 03/15/2018

## 2018-03-15 NOTE — Assessment & Plan Note (Addendum)
Continue diltiazem CD low dose and long  acting metoprolol

## 2018-03-15 NOTE — Progress Notes (Signed)
Patient ID: Anne Ingram, female    DOB: 1956/06/09  Age: 62 y.o. MRN: [id number]  The patient is here for annual preventive  examination and management of other chronic and acute problems.  NORMAL PAP 2017 MAMMOGRAM DUE  COLONOSCOPY 2013: HYPERPLASTIC POLYP Anne Ingram Annual derm Anne Ingram  Moh';s procedure for basal cell   taking otc vit d 600 Iu's    The risk factors are reflected in the social history.  The roster of all physicians providing medical care to patient - is listed in the Snapshot section of the chart.  Activities of daily living:  The patient is 100% independent in all ADLs: dressing, toileting, feeding as well as independent mobility  Home safety : The patient has smoke detectors in the home. They wear seatbelts.  There are no firearms at home. There is no violence in the home.   There is no risks for hepatitis, STDs or HIV. There is no   history of blood transfusion. They have no travel history to infectious disease endemic areas of the world.  The patient has seen their dentist in the last six month. They have seen their eye doctor in the last year. They admit to slight hearing difficulty with regard to whispered voices and some television programs.  They have deferred audiologic testing in the last year.  They do not  have excessive sun exposure. Discussed the need for sun protection: hats, long sleeves and use of sunscreen if there is significant sun exposure.   Diet: the importance of a healthy diet is discussed. They do have a healthy diet.  The benefits of regular aerobic exercise were discussed. She walks 4 times per week ,  20 minutes.   Depression screen: there are no signs or vegative symptoms of depression- irritability, change in appetite, anhedonia, sadness/tearfullness.  Cognitive assessment: the patient manages all their financial and personal affairs and is actively engaged. They could relate day,date,year and events; recalled 2/3 objects at 3 minutes;  performed clock-face test normally.  The following portions of the patient's history were reviewed and updated as appropriate: allergies, current medications, past family history, past medical history,  past surgical history, past social history  and problem list.  Visual acuity was not assessed per patient preference since she has regular follow up with her ophthalmologist. Hearing and body mass index were assessed and reviewed.   During the course of the visit the patient was educated and counseled about appropriate screening and preventive services including : fall prevention , diabetes screening, nutrition counseling, colorectal cancer screening, and recommended immunizations.    CC: The primary encounter diagnosis was Impaired fasting glucose. Diagnoses of Mixed hyperlipidemia, Vitamin D deficiency, Arrhythmia, atrial, Encounter for preventive health examination, and Prediabetes were also pertinent to this visit.  History Anne Ingram has a past medical history of Cardiac arrhythmia due to congenital heart disease, Chicken pox, Elevated blood pressure reading, and Hypertension.   She has no past surgical history on file.   Her family history includes Hypertension in her mother; Mental illness in her mother.She reports that she has quit smoking. She has never used smokeless tobacco. She reports that she drinks about 3.0 oz of alcohol per week. She reports that she does not use drugs.  Outpatient Medications Prior to Visit  Medication Sig Dispense Refill  . aspirin 81 MG tablet Take 81 mg by mouth daily. Reported on 10/23/2015    . Calcium Carbonate-Vitamin D (CALTRATE 600+D) 600-400 MG-UNIT tablet Take 1 tablet by mouth daily.    Marland Kitchen  RHOFADE 1 % CREA     . CARTIA XT 120 MG 24 hr capsule TAKE 1 CAPSULE (120 MG TOTAL) BY MOUTH DAILY. 90 capsule 0  . metoprolol succinate (TOPROL-XL) 50 MG 24 hr tablet TAKE 1 TABLET BY MOUTH DAILY. TAKE WITH OR IMMEDIATELY FOLLOWING A MEAL. 90 tablet 1  . PREMARIN  vaginal cream PLACE 1 APPLICATORFUL VAGINALLY 2 TIMES A WEEK. 30 g 11  . albuterol (PROVENTIL HFA) 108 (90 Base) MCG/ACT inhaler Inhale 2 puffs into the lungs every 6 (six) hours as needed for wheezing or shortness of breath. (Patient not taking: Reported on 03/15/2018) 1 Inhaler 1  . benzonatate (TESSALON) 200 MG capsule Take 1 capsule (200 mg total) by mouth 2 (two) times daily as needed for cough. (Patient not taking: Reported on 03/15/2018) 60 capsule 0  . predniSONE (DELTASONE) 10 MG tablet 6 tablets on Day 1 , then reduce by 1 tablet daily until gone (Patient not taking: Reported on 10/19/2017) 21 tablet 0  . sulfamethoxazole-trimethoprim (BACTRIM DS,SEPTRA DS) 800-160 MG tablet Take 1 tablet by mouth 2 (two) times daily. (Patient not taking: Reported on 03/15/2018) 10 tablet 0   No facility-administered medications prior to visit.     Review of Systems   Patient denies headache, fevers, malaise, unintentional weight loss, skin rash, eye pain, sinus congestion and sinus pain, sore throat, dysphagia,  hemoptysis , cough, dyspnea, wheezing, chest pain, palpitations, orthopnea, edema, abdominal pain, nausea, melena, diarrhea, constipation, flank pain, dysuria, hematuria, urinary  Frequency, nocturia, numbness, tingling, seizures,  Focal weakness, Loss of consciousness,  Tremor, insomnia, depression, anxiety, and suicidal ideation.      Objective:  BP 116/70 (BP Location: Left Arm, Patient Position: Sitting, Cuff Size: Normal)   Pulse 60   Temp 98.6 F (37 C) (Oral)   Resp 14   Ht 5' 1.75" (1.568 m)   Wt 166 lb 9.6 oz (75.6 kg)   SpO2 99%   BMI 30.72 kg/m   Physical Exam   General appearance: alert, cooperative and appears stated age Head: Normocephalic, without obvious abnormality, atraumatic Eyes: conjunctivae/corneas clear. PERRL, EOM's intact. Fundi benign. Ears: normal TM's and external ear canals both ears Nose: Nares normal. Septum midline. Mucosa normal. No drainage or sinus  tenderness. Throat: lips, mucosa, and tongue normal; teeth and gums normal Neck: no adenopathy, no carotid bruit, no JVD, supple, symmetrical, trachea midline and thyroid not enlarged, symmetric, no tenderness/mass/nodules Lungs: clear to auscultation bilaterally Breasts: normal appearance, no masses or tenderness Heart: regular rate and rhythm, S1, S2 normal, no murmur, click, rub or gallop Abdomen: soft, non-tender; bowel sounds normal; no masses,  no organomegaly Extremities: extremities normal, atraumatic, no cyanosis or edema Pulses: 2+ and symmetric Skin: Skin color, texture, turgor normal. No rashes or lesions Neurologic: Alert and oriented X 3, normal strength and tone. Normal symmetric reflexes. Normal coordination and gait.      Assessment & Plan:   Problem List Items Addressed This Visit    Vitamin D deficiency   Relevant Orders   VITAMIN D 25 Hydroxy (Vit-D Deficiency, Fractures) (Completed)   Prediabetes    Her random glucose is again elevated but not diagnostic of diabetes .  I recommend he follow a low glycemic index diet and particpate regularly in an aerobic  exercise activity.  We should check an A1c in 6 months.       Hyperlipidemia   Relevant Medications   diltiazem (CARTIA XT) 120 MG 24 hr capsule   metoprolol succinate (TOPROL-XL) 50  MG 24 hr tablet   Other Relevant Orders   Lipid panel (Completed)   Comprehensive metabolic panel (Completed)   TSH (Completed)   Encounter for preventive health examination    Annual comprehensive preventive exam was done as well as an evaluation and management of chronic conditions .  During the course of the visit the patient was educated and counseled about appropriate screening and preventive services including :  diabetes screening, lipid analysis with projected  10 year  risk for CAD , nutrition counseling, breast, cervical and colorectal cancer screening, and recommended immunizations.  Printed recommendations for health  maintenance screenings was given.  Lab Results  Component Value Date   HGBA1C 6.0 03/15/2018   Lab Results  Component Value Date   CHOL 197 03/15/2018   HDL 52.50 03/15/2018   LDLCALC 116 (H) 03/15/2018   LDLDIRECT 135.0 10/23/2015   TRIG 140.0 03/15/2018   CHOLHDL 4 03/15/2018         Arrhythmia, atrial    Continue diltiazem CD low dose and long  acting metoprolol      Relevant Medications   diltiazem (CARTIA XT) 120 MG 24 hr capsule   metoprolol succinate (TOPROL-XL) 50 MG 24 hr tablet    Other Visit Diagnoses    Impaired fasting glucose    -  Primary   Relevant Orders   Hemoglobin A1c (Completed)   VITAMIN D 25 Hydroxy (Vit-D Deficiency, Fractures) (Completed)      I have discontinued Jocelyn Lamer A. Posey's albuterol, benzonatate, predniSONE, and sulfamethoxazole-trimethoprim. I have also changed her CARTIA XT to diltiazem and PREMARIN to conjugated estrogens. Additionally, I am having her start on doxycycline and Zoster Vaccine Adjuvanted. Lastly, I am having her maintain her aspirin, Calcium Carbonate-Vitamin D, RHOFADE, and metoprolol succinate.  Meds ordered this encounter  Medications  . doxycycline (VIBRA-TABS) 100 MG tablet    Sig: Take 1 tablet (100 mg total) by mouth 2 (two) times daily.    Dispense:  20 tablet    Refill:  0  . Zoster Vaccine Adjuvanted [facility]) injection    Sig: Inject 0.5 mLs into the muscle once for 1 dose.    Dispense:  1 each    Refill:  1  . diltiazem (CARTIA XT) 120 MG 24 hr capsule    Sig: TAKE 1 CAPSULE (120 MG TOTAL) BY MOUTH DAILY.    Dispense:  90 capsule    Refill:  1  . metoprolol succinate (TOPROL-XL) 50 MG 24 hr tablet    Sig: TAKE 1 TABLET BY MOUTH DAILY. TAKE WITH OR IMMEDIATELY FOLLOWING A MEAL.    Dispense:  90 tablet    Refill:  1  . conjugated estrogens (PREMARIN) vaginal cream    Sig: PLACE 1 APPLICATORFUL VAGINALLY 2 TIMES A WEEK.    Dispense:  30 g    Refill:  11    Medications Discontinued During This  Encounter  Medication Reason  . albuterol (PROVENTIL HFA) 108 (90 Base) MCG/ACT inhaler Patient has not taken in last 30 days  . benzonatate (TESSALON) 200 MG capsule Patient has not taken in last 30 days  . predniSONE (DELTASONE) 10 MG tablet Completed Course  . sulfamethoxazole-trimethoprim (BACTRIM DS,SEPTRA DS) 800-160 MG tablet Completed Course  . CARTIA XT 120 MG 24 hr capsule Reorder  . metoprolol succinate (TOPROL-XL) 50 MG 24 hr tablet Reorder  . PREMARIN vaginal cream Reorder    Follow-up: No follow-ups on file.   Crecencio Mc, MD

## 2018-03-17 ENCOUNTER — Encounter: Payer: Self-pay | Admitting: Internal Medicine

## 2018-03-18 ENCOUNTER — Encounter: Payer: Self-pay | Admitting: Internal Medicine

## 2018-03-28 DIAGNOSIS — H524 Presbyopia: Secondary | ICD-10-CM | POA: Diagnosis not present

## 2018-05-29 ENCOUNTER — Other Ambulatory Visit: Payer: Self-pay | Admitting: Internal Medicine

## 2018-05-29 MED ORDER — ESTRADIOL 0.1 MG/GM VA CREA: 1.0000 | TOPICAL_CREAM | VAGINAL | 12 refills | Status: DC

## 2018-08-01 ENCOUNTER — Other Ambulatory Visit: Payer: Self-pay | Admitting: Internal Medicine

## 2018-08-01 DIAGNOSIS — Z1231 Encounter for screening mammogram for malignant neoplasm of breast: Secondary | ICD-10-CM

## 2018-08-11 ENCOUNTER — Ambulatory Visit
Admission: RE | Admit: 2018-08-11 | Discharge: 2018-08-11 | Disposition: A | Discharge status: Home / Self Care | Payer: 59 | Source: Ambulatory Visit | Attending: Internal Medicine | Admitting: Internal Medicine

## 2018-08-11 DIAGNOSIS — Z1231 Encounter for screening mammogram for malignant neoplasm of breast: Secondary | ICD-10-CM | POA: Insufficient documentation

## 2018-08-29 ENCOUNTER — Other Ambulatory Visit: Payer: Self-pay | Admitting: Internal Medicine

## 2018-10-16 IMAGING — DX DG CHEST 2V
2 series · 2 of 2 positions shown · non-contrast
Comparison: None.

CLINICAL DATA: 61-year-old presenting with a 5 day history of
productive cough.

EXAM:
CHEST  2 VIEW

[chest pa]
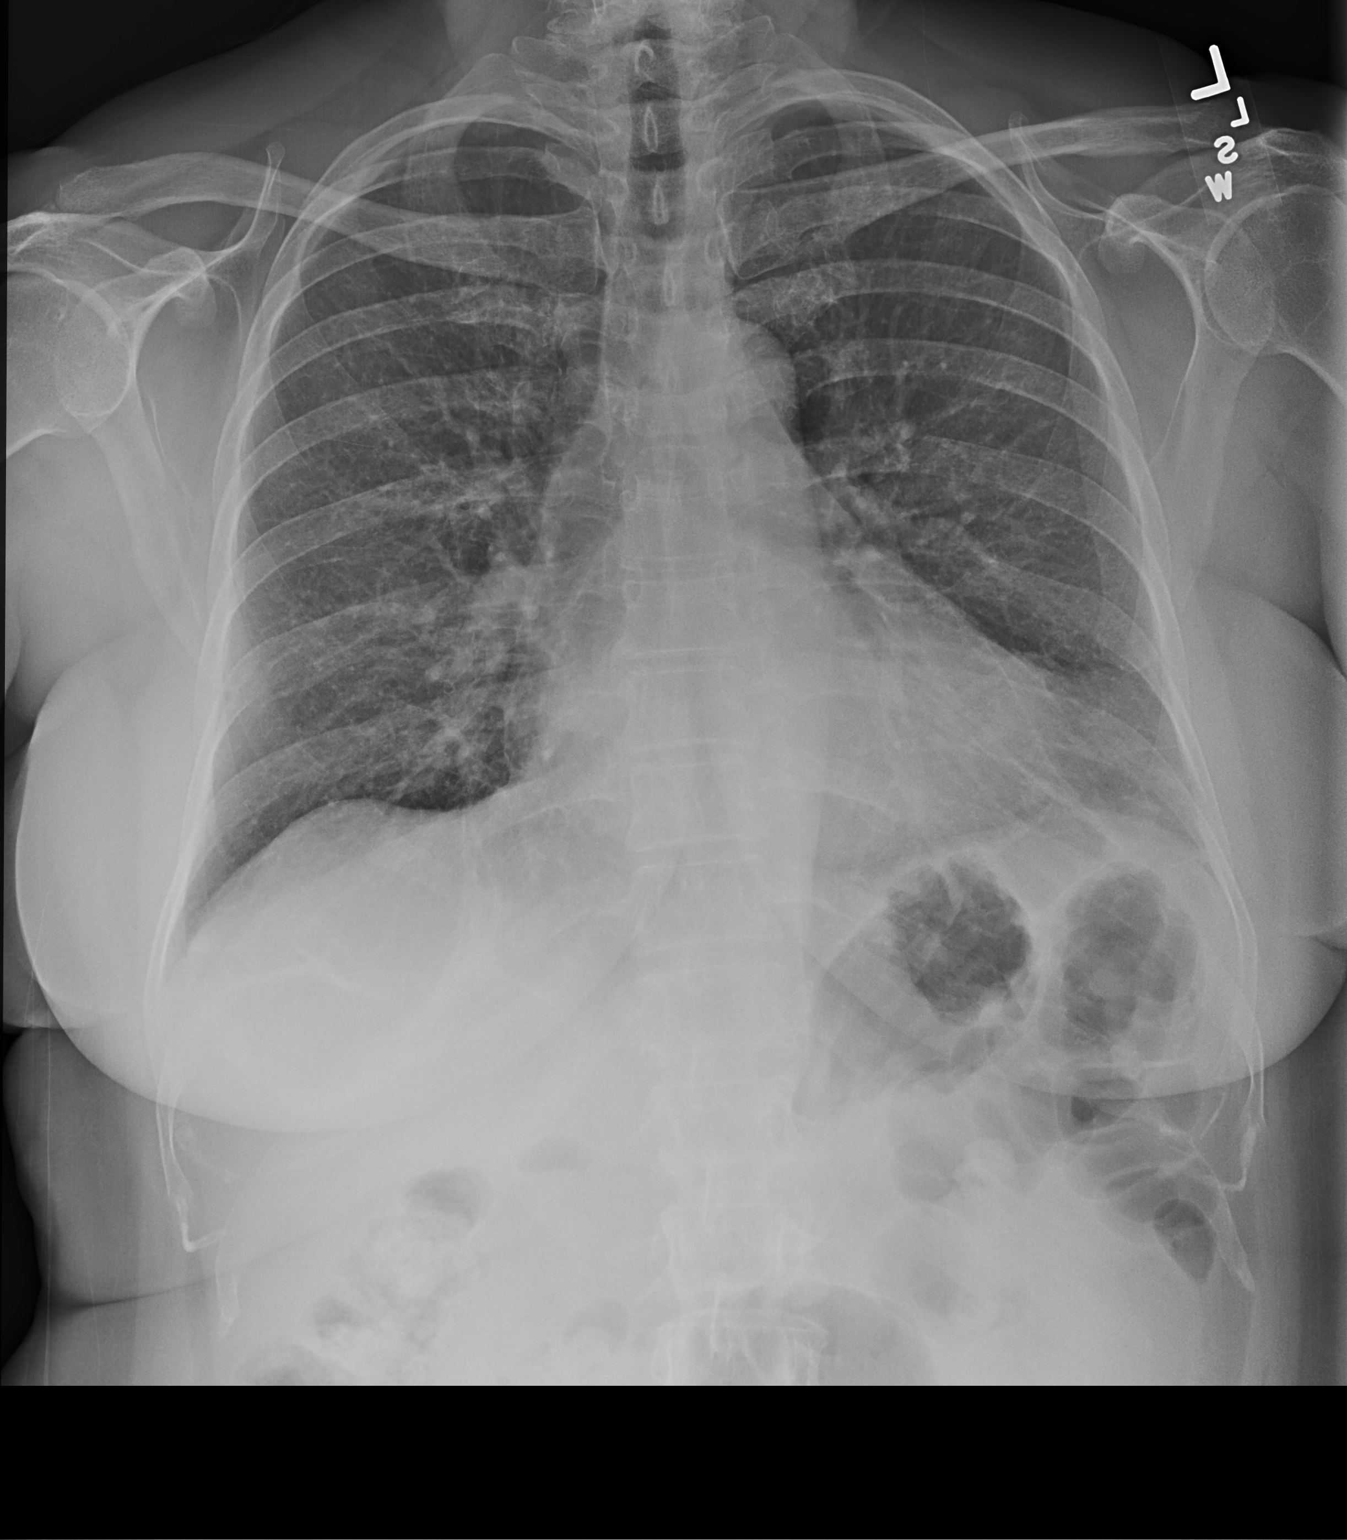

[chest lat]
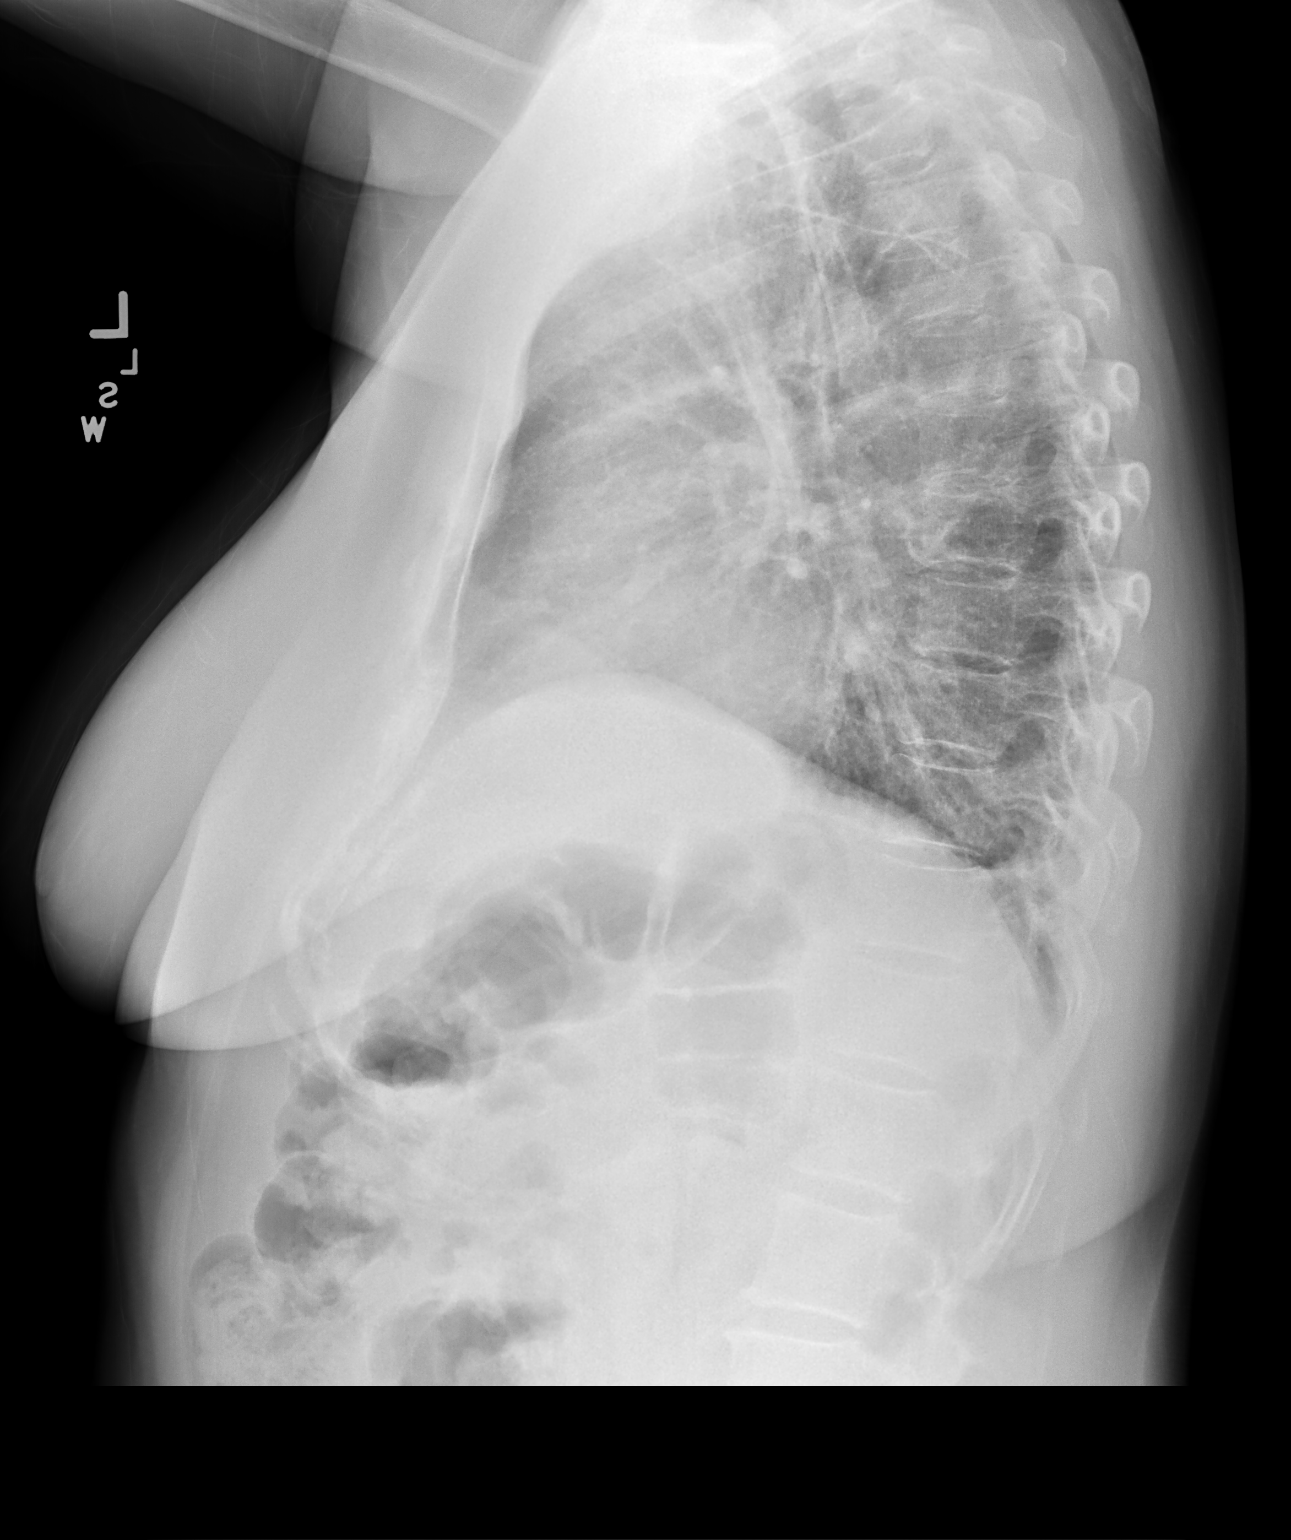

[2 of 2 positions shown; findings below may reference images not displayed]

FINDINGS: Cardiac silhouette mildly enlarged. Hilar and mediastinal contours
otherwise unremarkable. Linear atelectasis or scarring in the left
lower lobe. Lungs otherwise clear. No confluent airspace
consolidation. No pleural effusions. Visualized bony thorax intact
with a slight thoracolumbar levoscoliosis.
IMPRESSION: 1. Linear scar or atelectasis in the left lower lobe. No acute
cardiopulmonary disease otherwise.
2. Mild cardiomegaly without pulmonary edema.

## 2018-11-01 DIAGNOSIS — L812 Freckles: Secondary | ICD-10-CM | POA: Diagnosis not present

## 2018-11-01 DIAGNOSIS — L570 Actinic keratosis: Secondary | ICD-10-CM | POA: Diagnosis not present

## 2018-11-01 DIAGNOSIS — L739 Follicular disorder, unspecified: Secondary | ICD-10-CM | POA: Diagnosis not present

## 2018-11-01 DIAGNOSIS — L718 Other rosacea: Secondary | ICD-10-CM | POA: Diagnosis not present

## 2018-11-01 DIAGNOSIS — L578 Other skin changes due to chronic exposure to nonionizing radiation: Secondary | ICD-10-CM | POA: Diagnosis not present

## 2018-11-01 DIAGNOSIS — Z85828 Personal history of other malignant neoplasm of skin: Secondary | ICD-10-CM | POA: Diagnosis not present

## 2018-11-01 DIAGNOSIS — L304 Erythema intertrigo: Secondary | ICD-10-CM | POA: Diagnosis not present

## 2018-11-01 DIAGNOSIS — L918 Other hypertrophic disorders of the skin: Secondary | ICD-10-CM | POA: Diagnosis not present

## 2018-11-01 DIAGNOSIS — Z1283 Encounter for screening for malignant neoplasm of skin: Secondary | ICD-10-CM | POA: Diagnosis not present

## 2019-02-21 ENCOUNTER — Other Ambulatory Visit: Payer: Self-pay | Admitting: Internal Medicine

## 2019-02-21 DIAGNOSIS — I498 Other specified cardiac arrhythmias: Secondary | ICD-10-CM

## 2019-04-17 ENCOUNTER — Other Ambulatory Visit: Payer: Self-pay

## 2019-04-19 ENCOUNTER — Other Ambulatory Visit: Payer: Self-pay

## 2019-04-19 ENCOUNTER — Ambulatory Visit (INDEPENDENT_AMBULATORY_CARE_PROVIDER_SITE_OTHER): Payer: 59 | Admitting: Internal Medicine

## 2019-04-19 ENCOUNTER — Encounter: Payer: Self-pay | Admitting: Internal Medicine

## 2019-04-19 ENCOUNTER — Other Ambulatory Visit (HOSPITAL_COMMUNITY)
Admission: RE | Admit: 2019-04-19 | Discharge: 2019-04-19 | Disposition: A | Discharge status: Home / Self Care | Payer: 59 | Source: Ambulatory Visit | Attending: Internal Medicine | Admitting: Internal Medicine

## 2019-04-19 VITALS — BP 120/74 | HR 59 | Temp 97.9°F | Resp 15 | Ht 61.75 in | Wt 160.4 lb

## 2019-04-19 DIAGNOSIS — Z0000 Encounter for general adult medical examination without abnormal findings: Secondary | ICD-10-CM | POA: Diagnosis not present

## 2019-04-19 DIAGNOSIS — Z124 Encounter for screening for malignant neoplasm of cervix: Secondary | ICD-10-CM | POA: Insufficient documentation

## 2019-04-19 DIAGNOSIS — I498 Other specified cardiac arrhythmias: Secondary | ICD-10-CM

## 2019-04-19 DIAGNOSIS — R7303 Prediabetes: Secondary | ICD-10-CM

## 2019-04-19 DIAGNOSIS — E782 Mixed hyperlipidemia: Secondary | ICD-10-CM

## 2019-04-19 DIAGNOSIS — Z23 Encounter for immunization: Secondary | ICD-10-CM | POA: Diagnosis not present

## 2019-04-19 MED ORDER — METOPROLOL SUCCINATE ER 50 MG PO TB24: ORAL_TABLET | ORAL | 1 refills | Status: DC

## 2019-04-19 MED ORDER — DILTIAZEM HCL ER COATED BEADS 120 MG PO CP24: 120.0000 mg | ORAL_CAPSULE | Freq: Every day | ORAL | 1 refills | Status: DC

## 2019-04-19 NOTE — Patient Instructions (Addendum)
Ok to stop the diltiazem daily. Continue metoprolol daily  Use the cardizem as needed if you have a day of increased pulse   Health Maintenance for Postmenopausal Women Menopause is a normal process in which your ability to get pregnant comes to an end. This process happens slowly over many months or years, usually between the ages of 66 and 48. Menopause is complete when you have missed your menstrual periods for 12 months. It is important to talk with your health care provider about some of the most common conditions that affect women after menopause (postmenopausal women). These include heart disease, cancer, and bone loss (osteoporosis). Adopting a healthy lifestyle and getting preventive care can help to promote your health and wellness. The actions you take can also lower your chances of developing some of these common conditions. What should I know about menopause? During menopause, you may get a number of symptoms, such as:  Hot flashes. These can be moderate or severe.  Night sweats.  Decrease in sex drive.  Mood swings.  Headaches.  Tiredness.  Irritability.  Memory problems.  Insomnia. Choosing to treat or not to treat these symptoms is a decision that you make with your health care provider. Do I need hormone replacement therapy?  Hormone replacement therapy is effective in treating symptoms that are caused by menopause, such as hot flashes and night sweats.  Hormone replacement carries certain risks, especially as you become older. If you are thinking about using estrogen or estrogen with progestin, discuss the benefits and risks with your health care provider. What is my risk for heart disease and stroke? The risk of heart disease, heart attack, and stroke increases as you age. One of the causes may be a change in the body's hormones during menopause. This can affect how your body uses dietary fats, triglycerides, and cholesterol. Heart attack and stroke are medical  emergencies. There are many things that you can do to help prevent heart disease and stroke. Watch your blood pressure  High blood pressure causes heart disease and increases the risk of stroke. This is more likely to develop in people who have high blood pressure readings, are of African descent, or are overweight.  Have your blood pressure checked: ? Every 3-5 years if you are 2-38 years of age. ? Every year if you are 25 years old or older. Eat a healthy diet   Eat a diet that includes plenty of vegetables, fruits, low-fat dairy products, and lean protein.  Do not eat a lot of foods that are high in solid fats, added sugars, or sodium. Get regular exercise Get regular exercise. This is one of the most important things you can do for your health. Most adults should:  Try to exercise for at least 150 minutes each week. The exercise should increase your heart rate and make you sweat (moderate-intensity exercise).  Try to do strengthening exercises at least twice each week. Do these in addition to the moderate-intensity exercise.  Spend less time sitting. Even light physical activity can be beneficial. Other tips  Work with your health care provider to achieve or maintain a healthy weight.  Do not use any products that contain nicotine or tobacco, such as cigarettes, e-cigarettes, and chewing tobacco. If you need help quitting, ask your health care provider.  Know your numbers. Ask your health care provider to check your cholesterol and your blood sugar (glucose). Continue to have your blood tested as directed by your health care provider. Do I need  screening for cancer? Depending on your health history and family history, you may need to have cancer screening at different stages of your life. This may include screening for:  Breast cancer.  Cervical cancer.  Lung cancer.  Colorectal cancer. What is my risk for osteoporosis? After menopause, you may be at increased risk for  osteoporosis. Osteoporosis is a condition in which bone destruction happens more quickly than new bone creation. To help prevent osteoporosis or the bone fractures that can happen because of osteoporosis, you may take the following actions:  If you are 13-37 years old, get at least 1,000 mg of calcium and at least 600 mg of vitamin D per day.  If you are older than age 9 but younger than age 70, get at least 1,200 mg of calcium and at least 600 mg of vitamin D per day.  If you are older than age 82, get at least 1,200 mg of calcium and at least 800 mg of vitamin D per day. Smoking and drinking excessive alcohol increase the risk of osteoporosis. Eat foods that are rich in calcium and vitamin D, and do weight-bearing exercises several times each week as directed by your health care provider. How does menopause affect my mental health? Depression may occur at any age, but it is more common as you become older. Common symptoms of depression include:  Low or sad mood.  Changes in sleep patterns.  Changes in appetite or eating patterns.  Feeling an overall lack of motivation or enjoyment of activities that you previously enjoyed.  Frequent crying spells. Talk with your health care provider if you think that you are experiencing depression. General instructions See your health care provider for regular wellness exams and vaccines. This may include:  Scheduling regular health, dental, and eye exams.  Getting and maintaining your vaccines. These include: ? Influenza vaccine. Get this vaccine each year before the flu season begins. ? Pneumonia vaccine. ? Shingles vaccine. ? Tetanus, diphtheria, and pertussis (Tdap) booster vaccine. Your health care provider may also recommend other immunizations. Tell your health care provider if you have ever been abused or do not feel safe at home. Summary  Menopause is a normal process in which your ability to get pregnant comes to an end.  This  condition causes hot flashes, night sweats, decreased interest in sex, mood swings, headaches, or lack of sleep.  Treatment for this condition may include hormone replacement therapy.  Take actions to keep yourself healthy, including exercising regularly, eating a healthy diet, watching your weight, and checking your blood pressure and blood sugar levels.  Get screened for cancer and depression. Make sure that you are up to date with all your vaccines. This information is not intended to replace advice given to you by your health care provider. Make sure you discuss any questions you have with your health care provider. Document Released: 10/02/2005 Document Revised: 08/03/2018 Document Reviewed: 08/03/2018 Elsevier Patient Education  2020 [employer].

## 2019-04-20 LAB — COMPREHENSIVE METABOLIC PANEL
ALT: 17 U/L (ref 0–35)
AST: 21 U/L (ref 0–37)
Albumin: 4.4 g/dL (ref 3.5–5.2)
Alkaline Phosphatase: 53 U/L (ref 39–117)
BUN: 19 mg/dL (ref 6–23)
CO2: 28 mEq/L (ref 19–32)
Calcium: 9.5 mg/dL (ref 8.4–10.5)
Chloride: 103 mEq/L (ref 96–112)
Creatinine, Ser: 0.79 mg/dL (ref 0.40–1.20)
GFR: 73.59 mL/min (ref >60.00)
Glucose, Bld: 94 mg/dL (ref 70–99)
Potassium: 4.1 mEq/L (ref 3.5–5.1)
Sodium: 139 mEq/L (ref 135–145)
Total Bilirubin: 0.3 mg/dL (ref 0.2–1.2)
Total Protein: 7.1 g/dL (ref 6.0–8.3)

## 2019-04-20 LAB — LIPID PANEL
Cholesterol: 214 mg/dL — ABNORMAL HIGH (ref 0–200)
HDL: 58.3 mg/dL (ref >39.00)
LDL Cholesterol: 130 mg/dL — ABNORMAL HIGH (ref 0–99)
NonHDL: 155.37
Total CHOL/HDL Ratio: 4
Triglycerides: 129 mg/dL (ref 0.0–149.0)
VLDL: 25.8 mg/dL (ref 0.0–40.0)

## 2019-04-20 LAB — TSH: TSH: 1.85 u[IU]/mL (ref 0.35–4.50)

## 2019-04-20 LAB — HEMOGLOBIN A1C: Hgb A1c MFr Bld: 6.1 % (ref 4.6–6.5)

## 2019-04-20 NOTE — Assessment & Plan Note (Addendum)
10 yr risk is 8% ,  no need for medications.  Lab Results  Component Value Date   CHOL 214 (H) 04/19/2019   HDL 58.30 04/19/2019   LDLCALC 130 (H) 04/19/2019   LDLDIRECT 135.0 10/23/2015   TRIG 129.0 04/19/2019   CHOLHDL 4 04/19/2019

## 2019-04-20 NOTE — Assessment & Plan Note (Signed)

## 2019-04-20 NOTE — Progress Notes (Signed)
Patient ID: Anne Ingram, female    DOB: 1956-08-23  Age: 63 y.o. MRN: [id number]  The patient is here for annual preventive examination and management of other chronic and acute problems.   The risk factors are reflected in the social history.  The roster of all physicians providing medical care to patient - is listed in the Snapshot section of the chart.  Activities of daily living:  The patient is 100% independent in all ADLs: dressing, toileting, feeding as well as independent mobility  Home safety : The patient has smoke detectors in the home. They wear seatbelts.  There are no firearms at home. There is no violence in the home.   There is no risks for hepatitis, STDs or HIV. There is no   history of blood transfusion. They have no travel history to infectious disease endemic areas of the world.  The patient has seen their dentist in the last six month. They have seen their eye doctor in the last year. They deny hearing difficulty with regard to whispered voices and some television programs.  They have deferred audiologic testing in the last year.  They do not  have excessive sun exposure. Discussed the need for sun protection: hats, long sleeves and she confirms that she is using sunscreen if there is significant sun exposure.   Diet: the importance of a healthy diet is discussed. She does have a healthy diet.  The benefits of regular aerobic exercise were discussed. She walks 4 times per week ,  20 minutes.   Depression screen: there are no signs or vegative symptoms of depression- irritability, change in appetite, anhedonia, sadness/tearfullness.  Cognitive assessment: the patient manages all their financial and personal affairs and is actively engaged. They could relate day,date,year and events; recalled 2/3 objects at 3 minutes; performed clock-face test normally.  The following portions of the patient's history were reviewed and updated as appropriate: allergies, current  medications, past family history, past medical history,  past surgical history, past social history  and problem list.  Visual acuity was not assessed per patient preference since she has regular follow up with her ophthalmologist. Hearing and body mass index were assessed and reviewed.   During the course of the visit the patient was educated and counseled about appropriate screening and preventive services including : fall prevention , diabetes screening, nutrition counseling, colorectal cancer screening, and recommended immunizations.    CC: The primary encounter diagnosis was Mixed hyperlipidemia. Diagnoses of Need for immunization against influenza, Arrhythmia, atrial, Prediabetes, Cervical cancer screening, and Encounter for preventive health examination were also pertinent to this visit.  History Lira has a past medical history of Cardiac arrhythmia due to congenital heart disease, Chicken pox, Elevated blood pressure reading, and Hypertension.   She has no past surgical history on file.   Her family history includes Hypertension in her mother; Mental illness in her mother.She reports that she has quit smoking. She has never used smokeless tobacco. She reports current alcohol use of about 5.0 standard drinks of alcohol per week. She reports that she does not use drugs.  Outpatient Medications Prior to Visit  Medication Sig Dispense Refill  . aspirin 81 MG tablet Take 81 mg by mouth daily. Reported on 10/23/2015    . Calcium Carbonate-Vitamin D (CALTRATE 600+D) 600-400 MG-UNIT tablet Take 1 tablet by mouth daily.    Marland Kitchen estradiol (ESTRACE) 0.1 MG/GM vaginal cream Place 1 Applicatorful vaginally 2 (two) times a week. 42.5 g 12  . RHOFADE 1 %  CREA     . diltiazem (CARDIZEM CD) 120 MG 24 hr capsule TAKE 1 CAPSULE BY MOUTH DAILY. 30 capsule 0  . metoprolol succinate (TOPROL-XL) 50 MG 24 hr tablet TAKE 1 TABLET BY MOUTH DAILY. TAKE WITH OR IMMEDIATELY FOLLOWING A MEAL. 30 tablet 0  . doxycycline  (VIBRA-TABS) 100 MG tablet Take 1 tablet (100 mg total) by mouth 2 (two) times daily. (Patient not taking: Reported on 04/19/2019) 20 tablet 0   No facility-administered medications prior to visit.     Review of Systems   Patient denies headache, fevers, malaise, unintentional weight loss, skin rash, eye pain, sinus congestion and sinus pain, sore throat, dysphagia,  hemoptysis , cough, dyspnea, wheezing, chest pain, palpitations, orthopnea, edema, abdominal pain, nausea, melena, diarrhea, constipation, flank pain, dysuria, hematuria, urinary  Frequency, nocturia, numbness, tingling, seizures,  Focal weakness, Loss of consciousness,  Tremor, insomnia, depression, anxiety, and suicidal ideation.      Objective:  BP 120/74 (BP Location: Right Arm, Patient Position: Sitting, Cuff Size: Normal)   Pulse (!) 59   Temp 97.9 F (36.6 C) (Temporal)   Resp 15   Ht 5' 1.75" (1.568 m)   Wt 160 lb 6.4 oz (72.8 kg)   SpO2 98%   BMI 29.58 kg/m   Physical Exam   General Appearance:    Alert, cooperative, no distress, appears stated age  Head:    Normocephalic, without obvious abnormality, atraumatic  Eyes:    PERRL, conjunctiva/corneas clear, EOM's intact, fundi    benign, both eyes  Ears:    Normal TM's and external ear canals, both ears  Nose:   Nares normal, septum midline, mucosa normal, no drainage    or sinus tenderness  Throat:   Lips, mucosa, and tongue normal; teeth and gums normal  Neck:   Supple, symmetrical, trachea midline, no adenopathy;    thyroid:  no enlargement/tenderness/nodules; no carotid   bruit or JVD  Back:     Symmetric, no curvature, ROM normal, no CVA tenderness  Lungs:     Clear to auscultation bilaterally, respirations unlabored  Chest Wall:    No tenderness or deformity   Heart:    Regular rate and rhythm, S1 and S2 normal, no murmur, rub   or gallop  Breast Exam:    No tenderness, masses, or nipple abnormality  Abdomen:     Soft, non-tender, bowel sounds active  all four quadrants,    no masses, no organomegaly  Genitalia:    Pelvic: cervix normal in appearance, external genitalia normal, no adnexal masses or tenderness, no cervical motion tenderness, rectovaginal septum normal, uterus normal size, shape, and consistency and vagina normal without discharge  Extremities:   Extremities normal, atraumatic, no cyanosis or edema  Pulses:   2+ and symmetric all extremities  Skin:   Skin color, texture, turgor normal, no rashes or lesions  Lymph nodes:   Cervical, supraclavicular, and axillary nodes normal  Neurologic:   CNII-XII intact, normal strength, sensation and reflexes    throughout     Assessment & Plan:   Problem List Items Addressed This Visit      Unprioritized   Arrhythmia, atrial    Currently managed with metoprolol and diltiazem  She is requesting simplification of regimen    Advised to continue metoprolol only and use diltiazem prn        Relevant Medications   diltiazem (CARDIZEM CD) 120 MG 24 hr capsule   metoprolol succinate (TOPROL-XL) 50 MG 24 hr tablet  Encounter for preventive health examination    age appropriate education and counseling updated, referrals for preventative services and immunizations addressed, dietary and smoking counseling addressed, most recent labs reviewed.  I have personally reviewed and have noted:  1) the patient's medical and social history 2) The pt's use of alcohol, tobacco, and illicit drugs 3) The patient's current medications and supplements 4) Functional ability including ADL's, fall risk, home safety risk, hearing and visual impairment 5) Diet and physical activities 6) Evidence for depression or mood disorder 7) The patient's height, weight, and BMI have been recorded in the chart  I have made referrals, and provided counseling and education based on review of the above      Hyperlipidemia - Primary    10 yr risk is 8% ,  no need for medications.  Lab Results  Component Value  Date   CHOL 214 (H) 04/19/2019   HDL 58.30 04/19/2019   LDLCALC 130 (H) 04/19/2019   LDLDIRECT 135.0 10/23/2015   TRIG 129.0 04/19/2019   CHOLHDL 4 04/19/2019         Relevant Medications   diltiazem (CARDIZEM CD) 120 MG 24 hr capsule   metoprolol succinate (TOPROL-XL) 50 MG 24 hr tablet   Other Relevant Orders   Lipid panel (Completed)   TSH (Completed)   Prediabetes    Her random glucose is again elevated but not diagnostic of diabetes .  I recommend she follow a low glycemic index diet and particpate regularly in an aerobic  exercise activity.  We should check an A1c in 6 months.   Lab Results  Component Value Date   HGBA1C 6.1 04/19/2019         Relevant Orders   Hemoglobin A1c (Completed)   Comprehensive metabolic panel (Completed)    Other Visit Diagnoses    Need for immunization against influenza       Relevant Orders   Flu Vaccine QUAD 36+ mos IM (Completed)   Cervical cancer screening       Relevant Orders   Cytology - PAP      I have discontinued Jocelyn Lamer A. Shakir's doxycycline. I have also changed her diltiazem and metoprolol succinate. Additionally, I am having her maintain her aspirin, Calcium Carbonate-Vitamin D, Rhofade, and estradiol.  Meds ordered this encounter  Medications  . diltiazem (CARDIZEM CD) 120 MG 24 hr capsule    Sig: Take 1 capsule (120 mg total) by mouth daily.    Dispense:  90 capsule    Refill:  1  . metoprolol succinate (TOPROL-XL) 50 MG 24 hr tablet    Sig: Take with or immediately following a meal.    Dispense:  90 tablet    Refill:  1    Medications Discontinued During This Encounter  Medication Reason  . doxycycline (VIBRA-TABS) 100 MG tablet Patient has not taken in last 30 days  . metoprolol succinate (TOPROL-XL) 50 MG 24 hr tablet Reorder  . diltiazem (CARDIZEM CD) 120 MG 24 hr capsule Reorder    Follow-up: No follow-ups on file.   Crecencio Mc, MD

## 2019-04-20 NOTE — Assessment & Plan Note (Signed)
Her random glucose is again elevated but not diagnostic of diabetes .  I recommend she follow a low glycemic index diet and particpate regularly in an aerobic  exercise activity.  We should check an A1c in 6 months.   Lab Results  Component Value Date   HGBA1C 6.1 04/19/2019

## 2019-04-20 NOTE — Assessment & Plan Note (Signed)
Currently managed with metoprolol and diltiazem  She is requesting simplification of regimen    Advised to continue metoprolol only and use diltiazem prn

## 2019-04-21 LAB — CYTOLOGY - PAP
Adequacy: ABSENT
Diagnosis: NEGATIVE

## 2019-06-15 ENCOUNTER — Other Ambulatory Visit: Payer: Self-pay | Admitting: Internal Medicine

## 2019-07-07 ENCOUNTER — Other Ambulatory Visit: Payer: Self-pay | Admitting: Internal Medicine

## 2019-07-07 DIAGNOSIS — I498 Other specified cardiac arrhythmias: Secondary | ICD-10-CM

## 2020-01-15 ENCOUNTER — Other Ambulatory Visit: Payer: Self-pay | Admitting: Internal Medicine

## 2020-01-15 DIAGNOSIS — I498 Other specified cardiac arrhythmias: Secondary | ICD-10-CM

## 2020-02-07 ENCOUNTER — Other Ambulatory Visit: Payer: Self-pay | Admitting: Internal Medicine

## 2020-02-07 DIAGNOSIS — Z1231 Encounter for screening mammogram for malignant neoplasm of breast: Secondary | ICD-10-CM

## 2020-02-29 ENCOUNTER — Ambulatory Visit
Admission: RE | Admit: 2020-02-29 | Discharge: 2020-02-29 | Disposition: A | Discharge status: Home / Self Care | Payer: 59 | Source: Ambulatory Visit | Attending: Internal Medicine | Admitting: Internal Medicine

## 2020-02-29 DIAGNOSIS — Z1231 Encounter for screening mammogram for malignant neoplasm of breast: Secondary | ICD-10-CM | POA: Insufficient documentation

## 2020-04-22 ENCOUNTER — Ambulatory Visit (INDEPENDENT_AMBULATORY_CARE_PROVIDER_SITE_OTHER): Payer: 59 | Admitting: Internal Medicine

## 2020-04-22 ENCOUNTER — Other Ambulatory Visit: Payer: Self-pay | Admitting: Internal Medicine

## 2020-04-22 ENCOUNTER — Encounter: Payer: Self-pay | Admitting: Internal Medicine

## 2020-04-22 ENCOUNTER — Other Ambulatory Visit (HOSPITAL_COMMUNITY)
Admission: RE | Admit: 2020-04-22 | Discharge: 2020-04-22 | Disposition: A | Discharge status: Home / Self Care | Payer: 59 | Source: Ambulatory Visit | Attending: Internal Medicine | Admitting: Internal Medicine

## 2020-04-22 ENCOUNTER — Other Ambulatory Visit: Payer: Self-pay

## 2020-04-22 VITALS — BP 132/62 | HR 58 | Temp 98.2°F | Resp 15 | Ht 61.75 in | Wt 165.8 lb

## 2020-04-22 DIAGNOSIS — Z780 Asymptomatic menopausal state: Secondary | ICD-10-CM | POA: Diagnosis not present

## 2020-04-22 DIAGNOSIS — Z0000 Encounter for general adult medical examination without abnormal findings: Secondary | ICD-10-CM

## 2020-04-22 DIAGNOSIS — Z124 Encounter for screening for malignant neoplasm of cervix: Secondary | ICD-10-CM | POA: Insufficient documentation

## 2020-04-22 DIAGNOSIS — T753XXA Motion sickness, initial encounter: Secondary | ICD-10-CM | POA: Diagnosis not present

## 2020-04-22 DIAGNOSIS — R7303 Prediabetes: Secondary | ICD-10-CM | POA: Diagnosis not present

## 2020-04-22 DIAGNOSIS — I10 Essential (primary) hypertension: Secondary | ICD-10-CM | POA: Diagnosis not present

## 2020-04-22 DIAGNOSIS — I498 Other specified cardiac arrhythmias: Secondary | ICD-10-CM

## 2020-04-22 LAB — LIPID PANEL
Cholesterol: 215 mg/dL — ABNORMAL HIGH (ref 0–200)
HDL: 50.2 mg/dL (ref >39.00)
NonHDL: 164.77
Total CHOL/HDL Ratio: 4
Triglycerides: 213 mg/dL — ABNORMAL HIGH (ref 0.0–149.0)
VLDL: 42.6 mg/dL — ABNORMAL HIGH (ref 0.0–40.0)

## 2020-04-22 LAB — COMPREHENSIVE METABOLIC PANEL
ALT: 15 U/L (ref 0–35)
AST: 17 U/L (ref 0–37)
Albumin: 4.3 g/dL (ref 3.5–5.2)
Alkaline Phosphatase: 61 U/L (ref 39–117)
BUN: 17 mg/dL (ref 6–23)
CO2: 27 mEq/L (ref 19–32)
Calcium: 9.4 mg/dL (ref 8.4–10.5)
Chloride: 105 mEq/L (ref 96–112)
Creatinine, Ser: 0.71 mg/dL (ref 0.40–1.20)
GFR: 82.97 mL/min (ref >60.00)
Glucose, Bld: 92 mg/dL (ref 70–99)
Potassium: 3.7 mEq/L (ref 3.5–5.1)
Sodium: 140 mEq/L (ref 135–145)
Total Bilirubin: 0.4 mg/dL (ref 0.2–1.2)
Total Protein: 6.9 g/dL (ref 6.0–8.3)

## 2020-04-22 LAB — HEMOGLOBIN A1C: Hgb A1c MFr Bld: 6 % (ref 4.6–6.5)

## 2020-04-22 LAB — LDL CHOLESTEROL, DIRECT: Direct LDL: 128 mg/dL

## 2020-04-22 LAB — MICROALBUMIN / CREATININE URINE RATIO
Creatinine,U: 73.7 mg/dL
Microalb Creat Ratio: 0.9 mg/g (ref 0.0–30.0)
Microalb, Ur: <0.7 mg/dL (ref 0.0–1.9)

## 2020-04-22 LAB — TSH: TSH: 1.32 u[IU]/mL (ref 0.35–4.50)

## 2020-04-22 MED ORDER — ESTRADIOL 0.1 MG/GM VA CREA: TOPICAL_CREAM | VAGINAL | 3 refills | Status: DC

## 2020-04-22 MED ORDER — METOPROLOL SUCCINATE ER 50 MG PO TB24: ORAL_TABLET | ORAL | 3 refills | Status: DC

## 2020-04-22 MED ORDER — ONDANSETRON 4 MG PO TBDP: 4.0000 mg | ORAL_TABLET | Freq: Three times a day (TID) | ORAL | 0 refills | Status: DC | PRN

## 2020-04-22 NOTE — Progress Notes (Signed)
Patient ID: Anne Ingram, female    DOB: 1956-08-14  Age: 64 y.o. MRN: [id number]  The patient is here for annual wellness examination and management of other chronic and acute problems.  This visit occurred during the SARS-CoV-2 public health emergency.  Safety protocols were in place, including screening questions prior to the visit, additional usage of staff PPE, and extensive cleaning of exam room while observing appropriate contact time as indicated for disinfecting solutions.    Patient has received both doses of the available COVID 19 vaccine without complications.  Patient continues to mask when outside of the home except when walking in yard or at safe distances from others .  Patient denies any change in mood or development of unhealthy behaviors resuting from the pandemic's restriction of activities and socialization.     The risk factors are reflected in the social history.  The roster of all physicians providing medical care to patient - is listed in the Snapshot section of the chart.  Health Maintenance  Topic Date Due  . PAP SMEAR-Modifier  04/18/2020  . INFLUENZA VACCINE  11/21/2020 (Originally 03/24/2020)  . MAMMOGRAM  02/28/2021  . TETANUS/TDAP  11/25/2021  . COLONOSCOPY  08/22/2022  . COVID-19 Vaccine  Completed  . Hepatitis C Screening  Completed  . HIV Screening  Completed   Activities of daily living:  The patient is 100% independent in all ADLs: dressing, toileting, feeding as well as independent mobility  Home safety : The patient has smoke detectors in the home. They wear seatbelts.  There are no firearms at home. There is no violence in the home.   There is no risks for hepatitis, STDs or HIV. There is no   history of blood transfusion. They have no travel history to infectious disease endemic areas of the world.  The patient has seen their dentist in the last six month. They have seen their eye doctor in the last year. They admit to slight hearing difficulty with  regard to whispered voices and some television programs.  They have deferred audiologic testing in the last year.  They do not  have excessive sun exposure. Discussed the need for sun protection: hats, long sleeves and use of sunscreen if there is significant sun exposure.   Diet: the importance of a healthy diet is discussed. They do have a healthy diet.  The benefits of regular aerobic exercise were discussed. She walks 4 times per week ,  20 minutes.   Depression screen: there are no signs or vegative symptoms of depression- irritability, change in appetite, anhedonia, sadness/tearfullness.  Cognitive assessment: the patient manages all their financial and personal affairs and is actively engaged. They could relate day,date,year and events; recalled 2/3 objects at 3 minutes; performed clock-face test normally.  The following portions of the patient's history were reviewed and updated as appropriate: allergies, current medications, past family history, past medical history,  past surgical history, past social history  and problem list.  Visual acuity was not assessed per patient preference since she has regular follow up with her ophthalmologist. Hearing and body mass index were assessed and reviewed.   During the course of the visit the patient was educated and counseled about appropriate screening and preventive services including : fall prevention , diabetes screening, nutrition counseling, colorectal cancer screening, and recommended immunizations.    CC: The primary encounter diagnosis was Encounter for preventive health examination. Diagnoses of Arrhythmia, atrial, Prediabetes, Elevated blood pressure reading with diagnosis of hypertension, Cervical cancer screening,  Menopause, and Motion sickness, initial encounter were also pertinent to this visit.  1) Overweight/prediabetes   Wt gain of 5 lbs since last year.  Reviewed diet,  Discussed Optavia diet ,  Exercise   2)  Atrial arrhythmia :  has not had to use prn meds more than twice in the last 6 months   4) Motion sickness , new onset , occurring only while in car    Not on boats. Gets nauseated .. has tried bonine , helps but makes her "feel funny"  .  Discussed adding/substituting  zofran    History Sherra has a past medical history of Cardiac arrhythmia due to congenital heart disease, Chicken pox, Elevated blood pressure reading, and Hypertension.   She has no past surgical history on file.   Her family history includes Hypertension in her mother; Mental illness in her mother.She reports that she has quit smoking. She has never used smokeless tobacco. She reports current alcohol use of about 5.0 standard drinks of alcohol per week. She reports that she does not use drugs.  Outpatient Medications Prior to Visit  Medication Sig Dispense Refill  . aspirin 81 MG tablet Take 81 mg by mouth daily. Reported on 10/23/2015    . Calcium Carbonate-Vitamin D (CALTRATE 600+D) 600-400 MG-UNIT tablet Take 1 tablet by mouth daily.    Marland Kitchen diltiazem (CARDIZEM CD) 120 MG 24 hr capsule Take 1 capsule (120 mg total) by mouth daily. 90 capsule 1  . RHOFADE 1 % CREA     . estradiol (ESTRACE) 0.1 MG/GM vaginal cream PLACE 1 APPLICATORFUL VAGINALLY 2 TIMES A WEEK. 42.5 g 12  . metoprolol succinate (TOPROL-XL) 50 MG 24 hr tablet TAKE 1 TABLET BY MOUTH ONCE DAILY WITH OR IMMEDIATELY FOLLOWING A MEAL 90 tablet 1  . MODERNA COVID-19 VACCINE 100 MCG/0.5ML injection  (Patient not taking: Reported on 04/22/2020)     No facility-administered medications prior to visit.    Review of Systems   Patient denies headache, fevers, malaise, unintentional weight loss, skin rash, eye pain, sinus congestion and sinus pain, sore throat, dysphagia,  hemoptysis , cough, dyspnea, wheezing, chest pain, palpitations, orthopnea, edema, abdominal pain, nausea, melena, diarrhea, constipation, flank pain, dysuria, hematuria, urinary  Frequency, nocturia, numbness, tingling,  seizures,  Focal weakness, Loss of consciousness,  Tremor, insomnia, depression, anxiety, and suicidal ideation.     Objective:  BP 132/62 (BP Location: Left Arm, Patient Position: Sitting, Cuff Size: Normal)   Pulse (!) 58   Temp 98.2 F (36.8 C) (Oral)   Resp 15   Ht 5' 1.75" (1.568 m)   Wt 165 lb 12.8 oz (75.2 kg)   SpO2 97%   BMI 30.57 kg/m   Physical Exam  General Appearance:    Alert, cooperative, no distress, appears stated age  Head:    Normocephalic, without obvious abnormality, atraumatic  Eyes:    PERRL, conjunctiva/corneas clear, EOM's intact, fundi    benign, both eyes  Ears:    Normal TM's and external ear canals, both ears  Nose:   Nares normal, septum midline, mucosa normal, no drainage    or sinus tenderness  Throat:   Lips, mucosa, and tongue normal; teeth and gums normal  Neck:   Supple, symmetrical, trachea midline, no adenopathy;    thyroid:  no enlargement/tenderness/nodules; no carotid   bruit or JVD  Back:     Symmetric, no curvature, ROM normal, no CVA tenderness  Lungs:     Clear to auscultation bilaterally, respirations unlabored  Chest Wall:    No tenderness or deformity   Heart:    Regular rate and rhythm, S1 and S2 normal, no murmur, rub   or gallop  Breast Exam:    No tenderness, masses, or nipple abnormality  Abdomen:     Soft, non-tender, bowel sounds active all four quadrants,    no masses, no organomegaly  Genitalia:    Pelvic: cervix retroverted, external genitalia normal, no adnexal masses or tenderness, no cervical motion tenderness, rectovaginal septum normal, uterus normal size, shape, and consistency and vagina normal without malodorous  Discharge (white discharge noted, asymptomatic)  Extremities:   Extremities normal, atraumatic, no cyanosis or edema  Pulses:   2+ and symmetric all extremities  Skin:   Skin color, texture, turgor normal, no rashes or lesions  Lymph nodes:   Cervical, supraclavicular, and axillary nodes normal   Neurologic:   CNII-XII intact, normal strength, sensation and reflexes    throughout    Assessment & Plan:   Problem List Items Addressed This Visit      Unprioritized   Arrhythmia, atrial    Currently managed with scheduled daily  metoprolol and diltiazem prn.  No changes today       Relevant Medications   metoprolol succinate (TOPROL-XL) 50 MG 24 hr tablet   Other Relevant Orders   TSH (Completed)   Elevated blood pressure reading with diagnosis of hypertension    She has no history of hypertension but takes metoprolol for management of atrial arrhythmia.  She has had an elevated reading in the office   She has been asked to check her pressures at home and submit readings for evaluation. Renal function and screen for proteinuria are normal today Lab Results  Component Value Date   CREATININE 0.71 04/22/2020   Lab Results  Component Value Date   NA 140 04/22/2020   K 3.7 04/22/2020   CL 105 04/22/2020   CO2 27 04/22/2020   Lab Results  Component Value Date   MICROALBUR <0.7 04/22/2020           Relevant Medications   metoprolol succinate (TOPROL-XL) 50 MG 24 hr tablet   Other Relevant Orders   Microalbumin / creatinine urine ratio (Completed)   Encounter for preventive health examination - Primary    age appropriate education and counseling updated, referrals for preventative services and immunizations addressed, dietary and smoking counseling addressed, most recent labs reviewed.  I have personally reviewed and have noted:  1) the patient's medical and social history 2) The pt's use of alcohol, tobacco, and illicit drugs 3) The patient's current medications and supplements 4) Functional ability including ADL's, fall risk, home safety risk, hearing and visual impairment 5) Diet and physical activities 6) Evidence for depression or mood disorder 7) The patient's height, weight, and BMI have been recorded in the chart  I have made referrals, and provided  counseling and education based on review of the above      Menopause    Using vaginal estrogen for management of atrophic vaginitis with excellent results.        Motion sickness    Occurring only with automobiles,  Not boats.  Ondansetron rx'd for nausea.  Using bonine currently.       Prediabetes    Her random glucose is again elevated and her A1c is prediabetes range  .  I recommend she follow a low glycemic index diet (Optavia discussed) and particpate regularly in an aerobic  exercise activity.  We should  check an A1c in 6 months.   Lab Results  Component Value Date   HGBA1C 6.0 04/22/2020         Relevant Orders   Comprehensive metabolic panel (Completed)   Lipid panel (Completed)   Hemoglobin A1c (Completed)    Other Visit Diagnoses    Cervical cancer screening       Relevant Orders   Cytology - PAP( [facility])      I have discontinued Jocelyn Lamer A. Krizek's Moderna COVID-19 Vaccine. I have also changed her estradiol and metoprolol succinate. Additionally, I am having her start on ondansetron. Lastly, I am having her maintain her aspirin, Calcium Carbonate-Vitamin D, Rhofade, and diltiazem.  Meds ordered this encounter  Medications  . ondansetron (ZOFRAN ODT) 4 MG disintegrating tablet    Sig: Take 1 tablet (4 mg total) by mouth every 8 (eight) hours as needed for nausea or vomiting.    Dispense:  20 tablet    Refill:  0  . estradiol (ESTRACE) 0.1 MG/GM vaginal cream    Sig: Please give 3 tubes    Dispense:  135 g    Refill:  3  . metoprolol succinate (TOPROL-XL) 50 MG 24 hr tablet    Sig: Take with or immediately following a meal.    Dispense:  90 tablet    Refill:  3    Medications Discontinued During This Encounter  Medication Reason  . MODERNA COVID-19 VACCINE 100 MCG/0.5ML injection   . estradiol (ESTRACE) 0.1 MG/GM vaginal cream Reorder  . metoprolol succinate (TOPROL-XL) 50 MG 24 hr tablet Reorder    Follow-up: No follow-ups on file.   Crecencio Mc, MD

## 2020-04-22 NOTE — Patient Instructions (Addendum)
Continue Bonine for travel sickness or try taking Zofran (dissolve under tongue) for the nausea   colonoscopy due in 2023  TDap vaccine due in 2023   Health Maintenance for Postmenopausal Women Menopause is a normal process in which your ability to get pregnant comes to an end. This process happens slowly over many months or years, usually between the ages of 29 and 91. Menopause is complete when you have missed your menstrual periods for 12 months. It is important to talk with your health care provider about some of the most common conditions that affect women after menopause (postmenopausal women). These include heart disease, cancer, and bone loss (osteoporosis). Adopting a healthy lifestyle and getting preventive care can help to promote your health and wellness. The actions you take can also lower your chances of developing some of these common conditions. What should I know about menopause? During menopause, you may get a number of symptoms, such as:  Hot flashes. These can be moderate or severe.  Night sweats.  Decrease in sex drive.  Mood swings.  Headaches.  Tiredness.  Irritability.  Memory problems.  Insomnia. Choosing to treat or not to treat these symptoms is a decision that you make with your health care provider. Do I need hormone replacement therapy?  Hormone replacement therapy is effective in treating symptoms that are caused by menopause, such as hot flashes and night sweats.  Hormone replacement carries certain risks, especially as you become older. If you are thinking about using estrogen or estrogen with progestin, discuss the benefits and risks with your health care provider. What is my risk for heart disease and stroke? The risk of heart disease, heart attack, and stroke increases as you age. One of the causes may be a change in the body's hormones during menopause. This can affect how your body uses dietary fats, triglycerides, and cholesterol. Heart  attack and stroke are medical emergencies. There are many things that you can do to help prevent heart disease and stroke. Watch your blood pressure  High blood pressure causes heart disease and increases the risk of stroke. This is more likely to develop in people who have high blood pressure readings, are of African descent, or are overweight.  Have your blood pressure checked: ? Every 3-5 years if you are 25-38 years of age. ? Every year if you are 46 years old or older. Eat a healthy diet   Eat a diet that includes plenty of vegetables, fruits, low-fat dairy products, and lean protein.  Do not eat a lot of foods that are high in solid fats, added sugars, or sodium. Get regular exercise Get regular exercise. This is one of the most important things you can do for your health. Most adults should:  Try to exercise for at least 150 minutes each week. The exercise should increase your heart rate and make you sweat (moderate-intensity exercise).  Try to do strengthening exercises at least twice each week. Do these in addition to the moderate-intensity exercise.  Spend less time sitting. Even light physical activity can be beneficial. Other tips  Work with your health care provider to achieve or maintain a healthy weight.  Do not use any products that contain nicotine or tobacco, such as cigarettes, e-cigarettes, and chewing tobacco. If you need help quitting, ask your health care provider.  Know your numbers. Ask your health care provider to check your cholesterol and your blood sugar (glucose). Continue to have your blood tested as directed by your health care  provider. Do I need screening for cancer? Depending on your health history and family history, you may need to have cancer screening at different stages of your life. This may include screening for:  Breast cancer.  Cervical cancer.  Lung cancer.  Colorectal cancer. What is my risk for osteoporosis? After menopause, you  may be at increased risk for osteoporosis. Osteoporosis is a condition in which bone destruction happens more quickly than new bone creation. To help prevent osteoporosis or the bone fractures that can happen because of osteoporosis, you may take the following actions:  If you are 30-28 years old, get at least 1,000 mg of calcium and at least 600 mg of vitamin D per day.  If you are older than age 78 but younger than age 29, get at least 1,200 mg of calcium and at least 600 mg of vitamin D per day.  If you are older than age 76, get at least 1,200 mg of calcium and at least 800 mg of vitamin D per day. Smoking and drinking excessive alcohol increase the risk of osteoporosis. Eat foods that are rich in calcium and vitamin D, and do weight-bearing exercises several times each week as directed by your health care provider. How does menopause affect my mental health? Depression may occur at any age, but it is more common as you become older. Common symptoms of depression include:  Low or sad mood.  Changes in sleep patterns.  Changes in appetite or eating patterns.  Feeling an overall lack of motivation or enjoyment of activities that you previously enjoyed.  Frequent crying spells. Talk with your health care provider if you think that you are experiencing depression. General instructions See your health care provider for regular wellness exams and vaccines. This may include:  Scheduling regular health, dental, and eye exams.  Getting and maintaining your vaccines. These include: ? Influenza vaccine. Get this vaccine each year before the flu season begins. ? Pneumonia vaccine. ? Shingles vaccine. ? Tetanus, diphtheria, and pertussis (Tdap) booster vaccine. Your health care provider may also recommend other immunizations. Tell your health care provider if you have ever been abused or do not feel safe at home. Summary  Menopause is a normal process in which your ability to get pregnant  comes to an end.  This condition causes hot flashes, night sweats, decreased interest in sex, mood swings, headaches, or lack of sleep.  Treatment for this condition may include hormone replacement therapy.  Take actions to keep yourself healthy, including exercising regularly, eating a healthy diet, watching your weight, and checking your blood pressure and blood sugar levels.  Get screened for cancer and depression. Make sure that you are up to date with all your vaccines. This information is not intended to replace advice given to you by your health care provider. Make sure you discuss any questions you have with your health care provider. Document Revised: 08/03/2018 Document Reviewed: 08/03/2018 Elsevier Patient Education  2020 [employer].

## 2020-04-23 DIAGNOSIS — I10 Essential (primary) hypertension: Secondary | ICD-10-CM | POA: Insufficient documentation

## 2020-04-23 LAB — CYTOLOGY - PAP
Adequacy: ABSENT
Comment: NEGATIVE
Diagnosis: NEGATIVE
High risk HPV: NEGATIVE

## 2020-04-23 NOTE — Assessment & Plan Note (Signed)

## 2020-04-23 NOTE — Assessment & Plan Note (Signed)
She has no history of hypertension but takes metoprolol for management of atrial arrhythmia.  She has had an elevated reading in the office   She has been asked to check her pressures at home and submit readings for evaluation. Renal function and screen for proteinuria are normal today Lab Results  Component Value Date   CREATININE 0.71 04/22/2020   Lab Results  Component Value Date   NA 140 04/22/2020   K 3.7 04/22/2020   CL 105 04/22/2020   CO2 27 04/22/2020   Lab Results  Component Value Date   MICROALBUR <0.7 04/22/2020      

## 2020-04-23 NOTE — Assessment & Plan Note (Signed)
Occurring only with automobiles,  Not boats.  Ondansetron rx'd for nausea.  Using bonine currently.

## 2020-04-23 NOTE — Assessment & Plan Note (Signed)
Her random glucose is again elevated and her A1c is prediabetes range  .  I recommend she follow a low glycemic index diet (Optavia discussed) and particpate regularly in an aerobic  exercise activity.  We should check an A1c in 6 months.   Lab Results  Component Value Date   HGBA1C 6.0 04/22/2020

## 2020-04-23 NOTE — Assessment & Plan Note (Signed)
Using vaginal estrogen for management of atrophic vaginitis with excellent results.

## 2020-04-23 NOTE — Assessment & Plan Note (Signed)
Currently managed with scheduled daily  metoprolol and diltiazem prn.  No changes today

## 2020-04-24 DIAGNOSIS — Z124 Encounter for screening for malignant neoplasm of cervix: Secondary | ICD-10-CM | POA: Insufficient documentation

## 2020-04-24 NOTE — Assessment & Plan Note (Signed)
Her PAP smear was HPV negative but the endocervical zone was not sampled for the second year in a  Row due to very long vault.   So she will need a repeat in a year by a GYN.

## 2020-08-20 ENCOUNTER — Other Ambulatory Visit: Payer: Self-pay | Admitting: Internal Medicine

## 2020-12-31 ENCOUNTER — Other Ambulatory Visit: Payer: Self-pay

## 2020-12-31 ENCOUNTER — Other Ambulatory Visit: Payer: Self-pay | Admitting: Internal Medicine

## 2020-12-31 MED ORDER — ESTRADIOL 0.1 MG/GM VA CREA
TOPICAL_CREAM | VAGINAL | 3 refills | Status: DC
  Filled 2020-12-31: qty 42.5, 30d supply, fill #0

## 2020-12-31 MED FILL — Diltiazem HCl Coated Beads Cap ER 24HR 120 MG: ORAL | 30 days supply | Qty: 30 | Fill #0 | Status: AC

## 2020-12-31 MED FILL — Metoprolol Succinate Tab ER 24HR 50 MG (Tartrate Equiv): ORAL | 30 days supply | Qty: 30 | Fill #0 | Status: AC

## 2021-02-03 ENCOUNTER — Other Ambulatory Visit: Payer: Self-pay

## 2021-02-03 MED FILL — Diltiazem HCl Coated Beads Cap ER 24HR 120 MG: ORAL | 30 days supply | Qty: 30 | Fill #1 | Status: AC

## 2021-02-03 MED FILL — Metoprolol Succinate Tab ER 24HR 50 MG (Tartrate Equiv): ORAL | 30 days supply | Qty: 30 | Fill #1 | Status: AC

## 2021-03-04 ENCOUNTER — Other Ambulatory Visit: Payer: Self-pay | Admitting: Internal Medicine

## 2021-03-04 ENCOUNTER — Other Ambulatory Visit: Payer: Self-pay

## 2021-03-04 MED FILL — Metoprolol Succinate Tab ER 24HR 50 MG (Tartrate Equiv): ORAL | 30 days supply | Qty: 30 | Fill #2 | Status: AC

## 2021-03-05 ENCOUNTER — Other Ambulatory Visit: Payer: Self-pay

## 2021-03-05 MED ORDER — DILTIAZEM HCL ER COATED BEADS 120 MG PO CP24
ORAL_CAPSULE | Freq: Every day | ORAL | 1 refills | Status: DC
  Filled 2021-03-05: qty 30, 30d supply, fill #0
  Filled 2021-04-02: qty 30, 30d supply, fill #1
  Filled 2021-06-03: qty 30, 30d supply, fill #2

## 2021-04-02 ENCOUNTER — Other Ambulatory Visit: Payer: Self-pay

## 2021-04-02 MED FILL — Metoprolol Succinate Tab ER 24HR 50 MG (Tartrate Equiv): ORAL | 30 days supply | Qty: 30 | Fill #3 | Status: AC

## 2021-05-01 ENCOUNTER — Other Ambulatory Visit: Payer: Self-pay

## 2021-05-01 ENCOUNTER — Telehealth: Payer: Self-pay | Admitting: Internal Medicine

## 2021-05-01 DIAGNOSIS — I498 Other specified cardiac arrhythmias: Secondary | ICD-10-CM

## 2021-05-01 MED ORDER — METOPROLOL SUCCINATE ER 50 MG PO TB24
ORAL_TABLET | ORAL | 0 refills | Status: DC
  Filled 2021-05-01: qty 90, 90d supply, fill #0

## 2021-05-01 NOTE — Telephone Encounter (Signed)
Patient is scheduled for an appoinment 10/12 but she may be out of her metoprolol succinate (TOPROL-XL) 50 MG 24 hr tablet by this appointment.Please send a refill to the [facility] pharmacy on this medicine.

## 2021-06-03 ENCOUNTER — Other Ambulatory Visit: Payer: Self-pay

## 2021-06-04 ENCOUNTER — Ambulatory Visit (INDEPENDENT_AMBULATORY_CARE_PROVIDER_SITE_OTHER): Payer: BLUE CROSS/BLUE SHIELD | Admitting: Internal Medicine

## 2021-06-04 ENCOUNTER — Other Ambulatory Visit: Payer: Self-pay

## 2021-06-04 ENCOUNTER — Encounter: Payer: Self-pay | Admitting: Internal Medicine

## 2021-06-04 ENCOUNTER — Other Ambulatory Visit (HOSPITAL_COMMUNITY)
Admission: RE | Admit: 2021-06-04 | Discharge: 2021-06-04 | Disposition: A | Discharge status: Home / Self Care | Payer: BLUE CROSS/BLUE SHIELD | Source: Ambulatory Visit | Attending: Internal Medicine | Admitting: Internal Medicine

## 2021-06-04 VITALS — BP 118/68 | HR 58 | Temp 96.1°F | Ht 61.75 in | Wt 167.4 lb

## 2021-06-04 DIAGNOSIS — Z1231 Encounter for screening mammogram for malignant neoplasm of breast: Secondary | ICD-10-CM

## 2021-06-04 DIAGNOSIS — Z124 Encounter for screening for malignant neoplasm of cervix: Secondary | ICD-10-CM

## 2021-06-04 DIAGNOSIS — N6325 Unspecified lump in the left breast, overlapping quadrants: Secondary | ICD-10-CM

## 2021-06-04 DIAGNOSIS — Z0000 Encounter for general adult medical examination without abnormal findings: Secondary | ICD-10-CM

## 2021-06-04 DIAGNOSIS — Z23 Encounter for immunization: Secondary | ICD-10-CM | POA: Diagnosis not present

## 2021-06-04 DIAGNOSIS — N952 Postmenopausal atrophic vaginitis: Secondary | ICD-10-CM

## 2021-06-04 DIAGNOSIS — R7303 Prediabetes: Secondary | ICD-10-CM

## 2021-06-04 NOTE — Progress Notes (Signed)
Patient ID: [name], female    DOB: 1955/09/19  Age: 65 y.o. MRN: [id number]  The patient is here for annual preventive examination and management of other chronic and acute problems.  This visit occurred during the SARS-CoV-2 public health emergency.  Safety protocols were in place, including screening questions prior to the visit, additional usage of staff PPE, and extensive cleaning of exam room while observing appropriate contact time as indicated for disinfecting solutions.     The risk factors are reflected in the social history.  The roster of all physicians providing medical care to patient - is listed in the Snapshot section of the chart.  Activities of daily living:  The patient is 100% independent in all ADLs: dressing, toileting, feeding as well as independent mobility  Home safety : The patient has smoke detectors in the home. They wear seatbelts.  There are no firearms at home. There is no violence in the home.   There is no risks for hepatitis, STDs or HIV. There is no   history of blood transfusion. They have no travel history to infectious disease endemic areas of the world.  The patient has seen their dentist in the last six month. They have seen their eye doctor in the last year. They admit to slight hearing difficulty with regard to whispered voices and some television programs.  They have deferred audiologic testing in the last year.  They do not  have excessive sun exposure. Discussed the need for sun protection: hats, long sleeves and use of sunscreen if there is significant sun exposure.   Diet: the importance of a healthy diet is discussed. They do have a healthy diet.  The benefits of regular aerobic exercise were discussed. She walks 4 times per week ,  20 minutes.   Depression screen: there are no signs or vegative symptoms of depression- irritability, change in appetite, anhedonia, sadness/tearfullness.  Cognitive assessment: the patient manages all their  financial and personal affairs and is actively engaged. They could relate day,date,year and events; recalled 2/3 objects at 3 minutes; performed clock-face test normally.  The following portions of the patient's history were reviewed and updated as appropriate: allergies, current medications, past family history, past medical history,  past surgical history, past social history  and problem list.  Visual acuity was not assessed per patient preference since she has regular follow up with her ophthalmologist. Hearing and body mass index were assessed and reviewed.   During the course of the visit the patient was educated and counseled about appropriate screening and preventive services including : fall prevention , diabetes screening, nutrition counseling, colorectal cancer screening, and recommended immunizations.    CC: The primary encounter diagnosis was Encounter for screening mammogram for malignant neoplasm of breast. Diagnoses of Screening for cervical cancer, Mass overlapping multiple quadrants of left breast, Prediabetes, Need for immunization against influenza, Cervical cancer screening, Encounter for preventive health examination, and Postmenopausal atrophic vaginitis were also pertinent to this visit.  No issues today.  Feeling good.    History Calinda has a past medical history of Cardiac arrhythmia due to congenital heart disease, Chicken pox, Elevated blood pressure reading, and Hypertension.   She has no past surgical history on file.   Her family history includes Hypertension in her mother; Mental illness in her mother.She reports that she has quit smoking. She has never used smokeless tobacco. She reports current alcohol use of about 5.0 standard drinks per week. She reports that she does not use drugs.  Outpatient Medications Prior to Visit  Medication Sig Dispense Refill   aspirin 81 MG tablet Take 81 mg by mouth daily. Reported on 10/23/2015     Calcium Carbonate-Vitamin D  600-400 MG-UNIT tablet Take 1 tablet by mouth daily.     diltiazem (CARDIZEM CD) 120 MG 24 hr capsule Take 1 tablet by mouth daily. 90 capsule 1   estradiol (ESTRACE) 0.1 MG/GM vaginal cream Please give 3 tubes 135 g 3   estradiol (ESTRACE) 0.1 MG/GM vaginal cream INSERT ONE APPLICATORFUL VAGINALLY 2 TIMES A WEEK 127.5 g 3   metoprolol succinate (TOPROL-XL) 50 MG 24 hr tablet TAKE 1 TABLET BY MOUTH ONCE DAILY **TAKE WITH OR IMMEDIATELY FOLLOWING A MEAL** 90 tablet 0   ondansetron (ZOFRAN ODT) 4 MG disintegrating tablet Take 1 tablet (4 mg total) by mouth every 8 (eight) hours as needed for nausea or vomiting. 20 tablet 0   RHOFADE 1 % CREA      No facility-administered medications prior to visit.    Review of Systems  Patient denies headache, fevers, malaise, unintentional weight loss, skin rash, eye pain, sinus congestion and sinus pain, sore throat, dysphagia,  hemoptysis , cough, dyspnea, wheezing, chest pain, palpitations, orthopnea, edema, abdominal pain, nausea, melena, diarrhea, constipation, flank pain, dysuria, hematuria, urinary  Frequency, nocturia, numbness, tingling, seizures,  Focal weakness, Loss of consciousness,  Tremor, insomnia, depression, anxiety, and suicidal ideation.     Objective:  BP 118/68 (BP Location: Left Arm, Patient Position: Sitting, Cuff Size: Normal)   Pulse (!) 58   Temp (!) 96.1 F (35.6 C) (Temporal)   Ht 5' 1.75" (1.568 m)   Wt 167 lb 6.4 oz (75.9 kg)   SpO2 95%   BMI 30.87 kg/m   Physical Exam  General Appearance:    Alert, cooperative, no distress, appears stated age  Head:    Normocephalic, without obvious abnormality, atraumatic  Eyes:    PERRL, conjunctiva/corneas clear, EOM's intact, fundi    benign, both eyes  Ears:    Normal TM's and external ear canals, both ears  Nose:   Nares normal, septum midline, mucosa normal, no drainage    or sinus tenderness  Throat:   Lips, mucosa, and tongue normal; teeth and gums normal  Neck:   Supple,  symmetrical, trachea midline, no adenopathy;    thyroid:  no enlargement/tenderness/nodules; no carotid   bruit or JVD  Back:     Symmetric, no curvature, ROM normal, no CVA tenderness  Lungs:     Clear to auscultation bilaterally, respirations unlabored  Chest Wall:    No tenderness or deformity   Heart:    Regular rate and rhythm, S1 and S2 normal, no murmur, rub   or gallop  Breast Exam:    No tenderness, masses, or nipple abnormality  Abdomen:     Soft, non-tender, bowel sounds active all four quadrants,    no masses, no organomegaly  Genitalia:    Pelvic: cervix not visible due to long vaginal vault. , external genitalia normal, no adnexal masses or tenderness, no cervical motion tenderness, rectovaginal septum normal, uterus normal size, shape, and consistency and vagina normal without discharge  Extremities:   Extremities normal, atraumatic, no cyanosis or edema  Pulses:   2+ and symmetric all extremities  Skin:   Skin color, texture, turgor normal, no rashes or lesions  Lymph nodes:   Cervical, supraclavicular, and axillary nodes normal  Neurologic:   CNII-XII intact, normal strength, sensation and reflexes    throughout  Assessment & Plan:   Problem List Items Addressed This Visit       Unprioritized   Postmenopausal atrophic vaginitis    Using vaginal estrogen for management of atrophic vaginitis with excellent results.        Prediabetes    Her random glucose has been elevated and her A1c is prediabetes range  . She has been encouraged to follow a low glycemic index diet (Optavia discussed) and participate regularly in an aerobic  exercise activity.  Repeat testing needed  Lab Results  Component Value Date   HGBA1C 6.0 04/22/2020         Relevant Orders   Comprehensive metabolic panel   Lipid panel   Hemoglobin A1c   Encounter for preventive health examination    age appropriate education and counseling updated, referrals for preventative services and  immunizations addressed, dietary and smoking counseling addressed, most recent labs reviewed.  I have personally reviewed and have noted:   1) the patient's medical and social history 2) The pt's use of alcohol, tobacco, and illicit drugs 3) The patient's current medications and supplements 4) Functional ability including ADL's, fall risk, home safety risk, hearing and visual impairment 5) Diet and physical activities 6) Evidence for depression or mood disorder 7) The patient's height, weight, and BMI have been recorded in the chart   I have made referrals, and provided counseling and education based on review of the above      Cervical cancer screening    PAP smears have been HPV negative but no endocervical zone sampled for the last 2 exams.  Repeat done today .      Other Visit Diagnoses     Encounter for screening mammogram for malignant neoplasm of breast    -  Primary   Screening for cervical cancer       Relevant Orders   Cytology - PAP( [place])   Mass overlapping multiple quadrants of left breast       Relevant Orders   MM DIAG BREAST TOMO BILATERAL   US BREAST LTD UNI LEFT INC AXILLA   Need for immunization against influenza       Relevant Orders   Flu Vaccine QUAD High Dose(Fluad) (Completed)       I am having Jocelyn Lamer A. Mattice maintain her aspirin, Calcium Carbonate-Vitamin D, Rhofade, ondansetron, estradiol, estradiol, diltiazem, and metoprolol succinate.  No orders of the defined types were placed in this encounter.   There are no discontinued medications.  Follow-up: No follow-ups on file.   Crecencio Mc, MD

## 2021-06-04 NOTE — Patient Instructions (Signed)
DIAGNOSTIC MAMMOGRAM ORDERED TO EVALUATE THE LEFT BREAST MORE FULLY   LABS CAN BE DONE AT ANY [facility]

## 2021-06-05 NOTE — Assessment & Plan Note (Signed)

## 2021-06-05 NOTE — Assessment & Plan Note (Addendum)
PAP smears have been HPV negative but no endocervical zone sampled for the last 2 exams.  Repeat done today . 

## 2021-06-05 NOTE — Assessment & Plan Note (Signed)
Her random glucose has been elevated and her A1c is prediabetes range  . She has been encouraged to follow a low glycemic index diet (Optavia discussed) and participate regularly in an aerobic  exercise activity.  Repeat testing needed  Lab Results  Component Value Date   HGBA1C 6.0 04/22/2020

## 2021-06-05 NOTE — Assessment & Plan Note (Signed)
Using vaginal estrogen for management of atrophic vaginitis with excellent results.

## 2021-06-06 LAB — CYTOLOGY - PAP
Comment: NEGATIVE
Diagnosis: NEGATIVE
High risk HPV: NEGATIVE

## 2021-06-19 ENCOUNTER — Other Ambulatory Visit: Payer: Self-pay

## 2021-06-19 ENCOUNTER — Ambulatory Visit
Admission: RE | Admit: 2021-06-19 | Discharge: 2021-06-19 | Disposition: A | Discharge status: Home / Self Care | Payer: BLUE CROSS/BLUE SHIELD | Source: Ambulatory Visit | Attending: Internal Medicine | Admitting: Internal Medicine

## 2021-06-19 DIAGNOSIS — N6325 Unspecified lump in the left breast, overlapping quadrants: Secondary | ICD-10-CM

## 2021-06-23 ENCOUNTER — Other Ambulatory Visit: Payer: BLUE CROSS/BLUE SHIELD

## 2021-07-31 ENCOUNTER — Other Ambulatory Visit: Payer: Self-pay

## 2021-07-31 ENCOUNTER — Other Ambulatory Visit: Payer: Self-pay | Admitting: Internal Medicine

## 2021-07-31 DIAGNOSIS — I498 Other specified cardiac arrhythmias: Secondary | ICD-10-CM

## 2021-07-31 MED ORDER — METOPROLOL SUCCINATE ER 50 MG PO TB24
ORAL_TABLET | ORAL | 0 refills | Status: DC
  Filled 2021-07-31: qty 90, 90d supply, fill #0

## 2021-10-30 ENCOUNTER — Other Ambulatory Visit: Payer: Self-pay

## 2021-11-05 ENCOUNTER — Telehealth: Payer: Self-pay | Admitting: Internal Medicine

## 2021-11-05 DIAGNOSIS — I498 Other specified cardiac arrhythmias: Secondary | ICD-10-CM

## 2021-11-05 MED ORDER — METOPROLOL SUCCINATE ER 50 MG PO TB24: ORAL_TABLET | ORAL | 2 refills | Status: DC

## 2021-11-05 MED ORDER — ESTRADIOL 0.1 MG/GM VA CREA: TOPICAL_CREAM | VAGINAL | 0 refills | Status: DC

## 2021-11-05 MED ORDER — METOPROLOL SUCCINATE ER 50 MG PO TB24: ORAL_TABLET | ORAL | 0 refills | Status: DC

## 2021-11-05 NOTE — Telephone Encounter (Signed)
Pt needs a refill on metoprolol succinate and estradiol sent to [facility] [address] [place] [address] ?Store number [phone] ? ?

## 2021-11-05 NOTE — Telephone Encounter (Signed)
Medications have been refilled and pt is aware.  ?

## 2022-08-10 ENCOUNTER — Other Ambulatory Visit (HOSPITAL_COMMUNITY)
Admission: RE | Admit: 2022-08-10 | Discharge: 2022-08-10 | Disposition: A | Discharge status: Home / Self Care | Payer: Medicare Other | Source: Ambulatory Visit | Attending: Internal Medicine | Admitting: Internal Medicine

## 2022-08-10 ENCOUNTER — Encounter: Payer: Self-pay | Admitting: Internal Medicine

## 2022-08-10 ENCOUNTER — Ambulatory Visit (INDEPENDENT_AMBULATORY_CARE_PROVIDER_SITE_OTHER): Payer: Medicare Other | Admitting: Internal Medicine

## 2022-08-10 VITALS — BP 116/68 | HR 62 | Temp 98.2°F | Ht 61.75 in | Wt 163.2 lb

## 2022-08-10 DIAGNOSIS — Z1211 Encounter for screening for malignant neoplasm of colon: Secondary | ICD-10-CM

## 2022-08-10 DIAGNOSIS — R7303 Prediabetes: Secondary | ICD-10-CM

## 2022-08-10 DIAGNOSIS — Z0000 Encounter for general adult medical examination without abnormal findings: Secondary | ICD-10-CM | POA: Diagnosis not present

## 2022-08-10 DIAGNOSIS — Z01419 Encounter for gynecological examination (general) (routine) without abnormal findings: Secondary | ICD-10-CM | POA: Diagnosis present

## 2022-08-10 DIAGNOSIS — Z79899 Other long term (current) drug therapy: Secondary | ICD-10-CM

## 2022-08-10 DIAGNOSIS — Z124 Encounter for screening for malignant neoplasm of cervix: Secondary | ICD-10-CM | POA: Diagnosis not present

## 2022-08-10 DIAGNOSIS — Z1231 Encounter for screening mammogram for malignant neoplasm of breast: Secondary | ICD-10-CM

## 2022-08-10 DIAGNOSIS — E782 Mixed hyperlipidemia: Secondary | ICD-10-CM | POA: Diagnosis not present

## 2022-08-10 DIAGNOSIS — Z1151 Encounter for screening for human papillomavirus (HPV): Secondary | ICD-10-CM | POA: Diagnosis not present

## 2022-08-10 DIAGNOSIS — Z780 Asymptomatic menopausal state: Secondary | ICD-10-CM

## 2022-08-10 DIAGNOSIS — I498 Other specified cardiac arrhythmias: Secondary | ICD-10-CM

## 2022-08-10 DIAGNOSIS — I10 Essential (primary) hypertension: Secondary | ICD-10-CM

## 2022-08-10 MED ORDER — DILTIAZEM HCL 60 MG PO TABS: 60.0000 mg | ORAL_TABLET | Freq: Four times a day (QID) | ORAL | 0 refills | Status: DC

## 2022-08-10 MED ORDER — METOPROLOL SUCCINATE ER 50 MG PO TB24: ORAL_TABLET | ORAL | 2 refills | Status: DC

## 2022-08-10 MED ORDER — ESTRADIOL 0.1 MG/GM VA CREA: TOPICAL_CREAM | VAGINAL | 0 refills | Status: DC

## 2022-08-10 MED ORDER — DILTIAZEM HCL ER COATED BEADS 120 MG PO CP24: ORAL_CAPSULE | Freq: Every day | ORAL | 1 refills | Status: DC

## 2022-08-10 NOTE — Progress Notes (Signed)
The patient is here for the Welcome to  Medicare  preventive visit     has a past medical history of Cardiac arrhythmia due to congenital heart disease, Chicken pox, Elevated blood pressure reading, and Hypertension.    reports that she has quit smoking. She has never used smokeless tobacco. She reports current alcohol use of about 5.0 standard drinks of alcohol per week. She reports that she does not use drugs.   The roster of all physicians providing medical care to patient : Patient Care Team: Crecencio Mc, MD as PCP - General (Internal Medicine)  Activities of daily living:  The patient is 100% independent in all ADLs: dressing, toileting, feeding as well as independent mobility Fall risk was assessed by direct patient evaluation of patient's balance, gait and ability to risk from a chair and from a kneeling position. Home safety : The patient has smoke detectors in the home. They wear seatbelts.  There are no firearms at home. There is no violence in the home.  Patient has seen their eye doctor in the last year.   Visual acuity was assessed today  and was 20/20 with correction lenses. Patient denies hearing difficulty with regard to whispered voices and some television programs and has  deferred audiologic testing in the last year.   There is no risks for hepatitis, STDs or HIV. There is no   history of blood transfusion. They have no travel history to infectious disease endemic areas of the world.  The patient has seen their dentist in the last six month.  They do not  have excessive sun exposure. Discussed the need for sun protection: hats, long sleeves and use of sunscreen if there is significant sun exposure.   Diet: the importance of a healthy diet is discussed. They do have a healthy diet.  The benefits of regular aerobic exercise were discussed. Patient walks 4 times per week ,  a minimum of 20 minutes.   Depression screen:      08/10/2022    3:44 PM 06/04/2021    4:19 PM  04/22/2020   10:32 AM 04/19/2019    3:44 PM 03/15/2018    2:18 PM  Depression screen PHQ 2/9  Decreased Interest 0 0 0 0 0  Down, Depressed, Hopeless 0 0 0 0 0  PHQ - 2 Score 0 0 0 0 0  Altered sleeping 0   0 0  Tired, decreased energy 0   0 0  Change in appetite 0   0 0  Feeling bad or failure about yourself  0   0 0  Trouble concentrating 0   0 0  Moving slowly or fidgety/restless 0   0 0  Suicidal thoughts 0   0 0  PHQ-9 Score 0   0 0  Difficult doing work/chores Not difficult at all   Not difficult at all Not difficult at all       Cognitive assessment: the patient manages all their financial and personal affairs and is actively engaged. They could relate day,date,year and events; recalled 2/3 objects at 3 minutes; performed clock-face test normally.  The following portions of the patient's history were reviewed and updated as appropriate: allergies, current medications, past family history, past medical history,  past surgical history, past social history  and problem list.  During the course of the visit the patient was educated and counseled about appropriate screening and preventive services including : fall prevention , diabetes screening, nutrition counseling, colorectal cancer screening, and  recommended immunizations   Immunization History  Administered Date(s) Administered   Fluad Quad(high Dose 65+) 06/04/2021   Influenza,inj,Quad PF,6+ Mos 07/09/2016, 07/06/2017, 04/19/2019   Influenza-Unspecified 05/03/2018, 07/08/2022   Moderna Sars-Covid-2 Vaccination 11/06/2019, 12/04/2019   Td 11/26/2011   Zoster Recombinat (Shingrix) 05/03/2018, 10/07/2018  HMLISTPATIENTFRIENDLY@ Health Maintenance Due  Topic Date Due   DEXA SCAN  Never done   DTaP/Tdap/Td (2 - Tdap) 11/25/2021   PAP SMEAR-Modifier  06/04/2022   MAMMOGRAM  06/19/2022    Last skin cancer screening :      Vital Signs: BP 116/68   Pulse 62   Temp 98.2 F (36.8 C) (Oral)   Ht 5' 1.75" (1.568 m)    Wt 163 lb 3.2 oz (74 kg)   SpO2 98%   BMI 30.09 kg/m    Exam:Physical Exam Vitals reviewed.  Constitutional:      General: She is not in acute distress.    Appearance: Normal appearance. She is well-developed and normal weight. She is not ill-appearing, toxic-appearing or diaphoretic.  HENT:     Head: Normocephalic.     Right Ear: Tympanic membrane, ear canal and external ear normal. There is no impacted cerumen.     Left Ear: Tympanic membrane, ear canal and external ear normal. There is no impacted cerumen.     Nose: Nose normal.     Mouth/Throat:     Mouth: Mucous membranes are moist.     Pharynx: Oropharynx is clear.  Eyes:     General: No scleral icterus.       Right eye: No discharge.        Left eye: No discharge.     Conjunctiva/sclera: Conjunctivae normal.     Pupils: Pupils are equal, round, and reactive to light.  Neck:     Thyroid: No thyromegaly.     Vascular: No carotid bruit or JVD.  Cardiovascular:     Rate and Rhythm: Normal rate and regular rhythm.     Heart sounds: Normal heart sounds.  Pulmonary:     Effort: Pulmonary effort is normal. No respiratory distress.     Breath sounds: Normal breath sounds.  Chest:  Breasts:    Breasts are symmetrical.     Right: Normal. No swelling, inverted nipple, mass, nipple discharge, skin change or tenderness.     Left: Normal. No swelling, inverted nipple, mass, nipple discharge, skin change or tenderness.  Abdominal:     General: Bowel sounds are normal.     Palpations: Abdomen is soft. There is no mass.     Tenderness: There is no abdominal tenderness. There is no guarding or rebound.     Hernia: There is no hernia in the left inguinal area or right inguinal area.  Genitourinary:    Exam position: Lithotomy position.     Pubic Area: No rash or pubic lice.      Labia:        Right: No rash, tenderness, lesion or injury.        Left: No rash, tenderness, lesion or injury.      Vagina: Normal.     Cervix: Normal.      Uterus: Normal.      Adnexa: Right adnexa normal and left adnexa normal.  Musculoskeletal:        General: Normal range of motion.     Cervical back: Normal range of motion and neck supple.  Lymphadenopathy:     Cervical: No cervical adenopathy.     Upper Body:  Right upper body: No supraclavicular, axillary or pectoral adenopathy.     Left upper body: No supraclavicular, axillary or pectoral adenopathy.     Lower Body: No right inguinal adenopathy. No left inguinal adenopathy.  Skin:    General: Skin is warm and dry.  Neurological:     General: No focal deficit present.     Mental Status: She is alert and oriented to person, place, and time. Mental status is at baseline.  Psychiatric:        Mood and Affect: Mood normal.        Behavior: Behavior normal.        Thought Content: Thought content normal.        Judgment: Judgment normal.      End of Life Discussion and Planning   During the course of the visit , End of Life objectives were discussed at length.  Patient does not have a living will in place or a healthcare power of attorney.  Patient  was given printed information about advance directives and encouraged to return after discussing with their family.  Review of Opioid Prescriptions    Patient does not have a current opioid prescription Patient's risk factors for opioid use disorder was reviewed and includes NO ISSUES  Treatment of pain using non-opioid alternatives was reviewed  and encouraged   Patient risk for potential substance abuse was assessed and addressed with counselling.    Assessment and Plan   Problem List Items Addressed This Visit     Arrhythmia, atrial    She has been having infrequent episodes less than once weekly , once a month   controlled by deep breathing,  carotid sinus massage.  Advised to schedule  metoprolol and diltiazem       Relevant Medications   diltiazem (CARDIZEM CD) 120 MG 24 hr capsule   metoprolol succinate  (TOPROL-XL) 50 MG 24 hr tablet   diltiazem (CARDIZEM) 60 MG tablet   Cervical cancer screening - Primary    PAP smears have been HPV negative but no endocervical zone sampled for the last 2 exams.  Repeat done today .      Relevant Orders   Cytology - PAP( [place])   Elevated blood pressure reading with diagnosis of hypertension    She has no history of hypertension but takes metoprolol for management of atrial arrhythmia.  She has had an elevated reading in the office   She has been asked to check her pressures at home and submit readings for evaluation. Renal function and screen for proteinuria are normal today Lab Results  Component Value Date   CREATININE 0.71 04/22/2020   Lab Results  Component Value Date   NA 140 04/22/2020   K 3.7 04/22/2020   CL 105 04/22/2020   CO2 27 04/22/2020   Lab Results  Component Value Date   MICROALBUR <0.7 04/22/2020          Relevant Medications   diltiazem (CARDIZEM CD) 120 MG 24 hr capsule   metoprolol succinate (TOPROL-XL) 50 MG 24 hr tablet   diltiazem (CARDIZEM) 60 MG tablet   Encounter for preventive health examination    age appropriate education and counseling updated, referrals for preventative services and immunizations addressed, dietary and smoking counseling addressed, most recent labs reviewed.  I have personally reviewed and have noted:   1) the patient's medical and social history 2) The pt's use of alcohol, tobacco, and illicit drugs 3) The patient's current medications and supplements 4) Functional  ability including ADL's, fall risk, home safety risk, hearing and visual impairment 5) Diet and physical activities 6) Evidence for depression or mood disorder 7) The patient's height, weight, and BMI have been recorded in the chart 8) I have ordered and reviewed a 12 lead EKG and find that there are no acute changes and patient is in sinus rhythm.     I have made referrals, and provided counseling and education based on  review of the above       Hyperlipidemia   Relevant Medications   diltiazem (CARDIZEM CD) 120 MG 24 hr capsule   metoprolol succinate (TOPROL-XL) 50 MG 24 hr tablet   diltiazem (CARDIZEM) 60 MG tablet   Other Relevant Orders   Comp Met (CMET)   Lipid Profile   Direct LDL   Prediabetes   Relevant Orders   Comp Met (CMET)   HgB A1c   Other Visit Diagnoses     Postmenopausal estrogen deficiency       Relevant Orders   DG Bone Density   Encounter for screening mammogram for malignant neoplasm of breast       Relevant Orders   MM 3D SCREEN BREAST BILATERAL   Welcome to Medicare preventive visit       Relevant Orders   EKG 12-Lead   Long-term use of high-risk medication       Relevant Orders   CBC with Differential/Platelet   TSH   Colon cancer screening       Relevant Orders   Cologuard

## 2022-08-10 NOTE — Assessment & Plan Note (Addendum)
She has been having infrequent episodes less than once weekly , once a month   controlled by deep breathing,  carotid sinus massage.  Advised to schedule  metoprolol and diltiazem

## 2022-08-10 NOTE — Patient Instructions (Signed)
Start taking diltiazem and metoprolol daily as you did today.  If your blood pressure becomes too low on this  regimen ( too low is > 110/60) , stop the cardizem and use the short acting cardizem "AS NEEDED"  FOR EPISODES    You NEED to  RENEW YOUR tetanus-diptheria-pertussis vaccine (TDaP) but you can get it for FREE at your local pharmacy   Your annual mammogram AND  DEXA  SCAN have been ordered.  Please call [facility] to call to make your appointments  .  The phone number for [facility] is  [phone]

## 2022-08-10 NOTE — Assessment & Plan Note (Signed)
She has no history of hypertension but takes metoprolol for management of atrial arrhythmia.  She has had an elevated reading in the office   She has been asked to check her pressures at home and submit readings for evaluation. Renal function and screen for proteinuria are normal today Lab Results  Component Value Date   CREATININE 0.71 04/22/2020   Lab Results  Component Value Date   NA 140 04/22/2020   K 3.7 04/22/2020   CL 105 04/22/2020   CO2 27 04/22/2020   Lab Results  Component Value Date   MICROALBUR <0.7 04/22/2020

## 2022-08-10 NOTE — Assessment & Plan Note (Signed)

## 2022-08-10 NOTE — Assessment & Plan Note (Signed)
PAP smears have been HPV negative but no endocervical zone sampled for the last 2 exams.  Repeat done today .

## 2022-08-11 LAB — COMPREHENSIVE METABOLIC PANEL
ALT: 14 U/L (ref 0–35)
AST: 17 U/L (ref 0–37)
Albumin: 4.4 g/dL (ref 3.5–5.2)
Alkaline Phosphatase: 64 U/L (ref 39–117)
BUN: 15 mg/dL (ref 6–23)
CO2: 27 mEq/L (ref 19–32)
Calcium: 9.5 mg/dL (ref 8.4–10.5)
Chloride: 105 mEq/L (ref 96–112)
Creatinine, Ser: 0.81 mg/dL (ref 0.40–1.20)
GFR: 75.9 mL/min (ref >60.00)
Glucose, Bld: 96 mg/dL (ref 70–99)
Potassium: 4.3 mEq/L (ref 3.5–5.1)
Sodium: 140 mEq/L (ref 135–145)
Total Bilirubin: 0.4 mg/dL (ref 0.2–1.2)
Total Protein: 7.2 g/dL (ref 6.0–8.3)

## 2022-08-11 LAB — CBC WITH DIFFERENTIAL/PLATELET
Basophils Absolute: 0.1 10*3/uL (ref 0.0–0.1)
Basophils Relative: 1 % (ref 0.0–3.0)
Eosinophils Absolute: 0.1 10*3/uL (ref 0.0–0.7)
Eosinophils Relative: 1.7 % (ref 0.0–5.0)
HCT: 40.9 % (ref 36.0–46.0)
Hemoglobin: 13.7 g/dL (ref 12.0–15.0)
Lymphocytes Relative: 45.5 % (ref 12.0–46.0)
Lymphs Abs: 3.4 10*3/uL (ref 0.7–4.0)
MCHC: 33.4 g/dL (ref 30.0–36.0)
MCV: 89.1 fl (ref 78.0–100.0)
Monocytes Absolute: 0.4 10*3/uL (ref 0.1–1.0)
Monocytes Relative: 4.7 % (ref 3.0–12.0)
Neutro Abs: 3.5 10*3/uL (ref 1.4–7.7)
Neutrophils Relative %: 47.1 % (ref 43.0–77.0)
Platelets: 267 10*3/uL (ref 150.0–400.0)
RBC: 4.59 Mil/uL (ref 3.87–5.11)
RDW: 12.3 % (ref 11.5–15.5)
WBC: 7.5 10*3/uL (ref 4.0–10.5)

## 2022-08-11 LAB — LIPID PANEL
Cholesterol: 208 mg/dL — ABNORMAL HIGH (ref 0–200)
HDL: 59.7 mg/dL (ref >39.00)
LDL Cholesterol: 127 mg/dL — ABNORMAL HIGH (ref 0–99)
NonHDL: 148.67
Total CHOL/HDL Ratio: 3
Triglycerides: 109 mg/dL (ref 0.0–149.0)
VLDL: 21.8 mg/dL (ref 0.0–40.0)

## 2022-08-11 LAB — LDL CHOLESTEROL, DIRECT: Direct LDL: 131 mg/dL

## 2022-08-11 LAB — HEMOGLOBIN A1C: Hgb A1c MFr Bld: 6.2 % (ref 4.6–6.5)

## 2022-08-11 LAB — TSH: TSH: 1.48 u[IU]/mL (ref 0.35–5.50)

## 2022-08-12 ENCOUNTER — Encounter: Payer: Self-pay | Admitting: Internal Medicine

## 2022-08-12 NOTE — Telephone Encounter (Signed)
noted 

## 2022-08-15 ENCOUNTER — Other Ambulatory Visit: Payer: Self-pay | Admitting: Internal Medicine

## 2022-08-18 LAB — CYTOLOGY - PAP
Comment: NEGATIVE
Diagnosis: NEGATIVE
High risk HPV: NEGATIVE

## 2022-09-01 LAB — COLOGUARD: COLOGUARD: POSITIVE — AB

## 2022-09-03 ENCOUNTER — Telehealth: Payer: Self-pay

## 2022-09-03 NOTE — Telephone Encounter (Signed)
LMTCB in regards to results.

## 2022-09-03 NOTE — Telephone Encounter (Signed)
-----   Message from Crecencio Mc, MD sent at 09/02/2022  1:00 PM EST -----  The results of your cologuard test are abnormal (Positive) . This may indicate a polyp somewhere in the colon.  I  would like to refer you  To a gastroenterologist  for a colonoscopy for further evaluation .   Are you  willing to see one,  And do you have a preference?

## 2022-09-04 ENCOUNTER — Encounter: Payer: Self-pay | Admitting: Internal Medicine

## 2022-09-25 ENCOUNTER — Encounter: Payer: Self-pay | Admitting: Nurse Practitioner

## 2022-09-25 ENCOUNTER — Ambulatory Visit (INDEPENDENT_AMBULATORY_CARE_PROVIDER_SITE_OTHER): Payer: Medicare Other | Admitting: Nurse Practitioner

## 2022-09-25 ENCOUNTER — Ambulatory Visit: Payer: Medicare Other | Attending: Nurse Practitioner

## 2022-09-25 ENCOUNTER — Telehealth: Payer: Self-pay | Admitting: Internal Medicine

## 2022-09-25 VITALS — BP 136/82 | HR 78 | Temp 97.9°F | Ht 61.75 in | Wt 164.2 lb

## 2022-09-25 DIAGNOSIS — R002 Palpitations: Secondary | ICD-10-CM

## 2022-09-25 NOTE — Patient Instructions (Addendum)
Zio heart monitor ordered. The monitor will arrive through mail by Monday or Tuesday. Read the instructions and place the monitor on for 14 days. Download the Zio app on the phone and write down the symptoms that you experience. If have any difficulty placing the monitor, you can call the office to schedule a nurse visit.   Place the monitor in the pack a mail it back after 14 days. Take metoprolol 50 mg during the day time and Diltiazem 120 mg during night.

## 2022-09-25 NOTE — Telephone Encounter (Signed)
noted 

## 2022-09-25 NOTE — Progress Notes (Unsigned)
Established Patient Office Visit  Subjective:  Patient ID: Anne Ingram, female    DOB: 09/27/1955  Age: 67 y.o. MRN: [id number]  CC:  Chief Complaint  Patient presents with   Acute Visit    Rapid Heart Rate yesterday     HPI  Anne Ingram presents for rapid heart rate off and on going on from 28 years intermittently. Since past 6 months she is having more frequent palpitation once or twice a month.  Usually she can bare down, message the carotid and lye  down.   She drinks 1 cup of coffee daily and drinks a shot of vodaka/ wine twice a week.    HPI   Past Medical History:  Diagnosis Date   Cardiac arrhythmia due to congenital heart disease    Chicken pox    Elevated blood pressure reading    Hypertension     No past surgical history on file.  Family History  Problem Relation Age of Onset   Hypertension Mother    Mental illness Mother    Cancer Neg Hx    Breast cancer Neg Hx     Social History   Socioeconomic History   Marital status: Married    Spouse name: Not on file   Number of children: Not on file   Years of education: Not on file   Highest education level: Not on file  Occupational History   Not on file  Tobacco Use   Smoking status: Former   Smokeless tobacco: Never  Substance and Sexual Activity   Alcohol use: Yes    Alcohol/week: 5.0 standard drinks of alcohol    Types: 5 Standard drinks or equivalent per week    Comment: rarely   Drug use: No   Sexual activity: Not on file  Other Topics Concern   Not on file  Social History Narrative   Not on file   Social Determinants of Health   Financial Resource Strain: Not on file  Food Insecurity: Not on file  Transportation Needs: Not on file  Physical Activity: Not on file  Stress: Not on file  Social Connections: Not on file  Intimate Partner Violence: Not on file     Outpatient Medications Prior to Visit  Medication Sig Dispense Refill   aspirin 81 MG tablet Take 81 mg by mouth  daily. Reported on 10/23/2015     Calcium Carbonate-Vitamin D 600-400 MG-UNIT tablet Take 1 tablet by mouth daily.     diltiazem (CARDIZEM CD) 120 MG 24 hr capsule Take 1 tablet by mouth daily. 90 capsule 1   diltiazem (CARDIZEM) 60 MG tablet Take 1 tablet (60 mg total) by mouth 4 (four) times daily. 30 tablet 0   estradiol (ESTRACE) 0.1 MG/GM vaginal cream INSERT ONE APPLICATORFUL VAGINALLY 2 TIMES A WEEK 127.5 g 0   metoprolol succinate (TOPROL-XL) 50 MG 24 hr tablet TAKE 1 TABLET BY MOUTH ONCE DAILY **TAKE WITH OR IMMEDIATELY FOLLOWING A MEAL** 90 tablet 2   RHOFADE 1 % CREA      No facility-administered medications prior to visit.    Allergies  Allergen Reactions   Penicillins     ROS Review of Systems    Objective:    Physical Exam  BP 136/82   Pulse 78   Temp 97.9 F (36.6 C)   Ht 5' 1.75" (1.568 m)   Wt 164 lb 3.2 oz (74.5 kg)   SpO2 99%   BMI 30.28 kg/m  Wt Readings from Last  3 Encounters:  09/25/22 164 lb 3.2 oz (74.5 kg)  08/10/22 163 lb 3.2 oz (74 kg)  06/04/21 167 lb 6.4 oz (75.9 kg)     Health Maintenance  Topic Date Due   COVID-19 Vaccine (3 - Moderna risk series) 01/01/2020   DEXA SCAN  Never done   DTaP/Tdap/Td (2 - Tdap) 11/25/2021   MAMMOGRAM  06/19/2022   COLONOSCOPY (Pts 45-67yr Insurance coverage will need to be confirmed)  08/22/2022   Pneumonia Vaccine 66 Years old (1 - PCV) 08/11/2023 (Originally 08/30/2021)   PAP SMEAR-Modifier  08/11/2023   Medicare Annual Wellness (AWV)  08/11/2023   INFLUENZA VACCINE  Completed   Hepatitis C Screening  Completed   Zoster Vaccines- Shingrix  Completed   HPV VACCINES  Aged Out    There are no preventive care reminders to display for this patient.  Lab Results  Component Value Date   TSH 1.48 08/10/2022   Lab Results  Component Value Date   WBC 7.5 08/10/2022   HGB 13.7 08/10/2022   HCT 40.9 08/10/2022   MCV 89.1 08/10/2022   PLT 267.0 08/10/2022   Lab Results  Component Value Date   NA  140 08/10/2022   K 4.3 08/10/2022   CO2 27 08/10/2022   GLUCOSE 96 08/10/2022   BUN 15 08/10/2022   CREATININE 0.81 08/10/2022   BILITOT 0.4 08/10/2022   ALKPHOS 64 08/10/2022   AST 17 08/10/2022   ALT 14 08/10/2022   PROT 7.2 08/10/2022   ALBUMIN 4.4 08/10/2022   CALCIUM 9.5 08/10/2022   ANIONGAP 9 06/02/2012   GFR 75.90 08/10/2022   Lab Results  Component Value Date   CHOL 208 (H) 08/10/2022   Lab Results  Component Value Date   HDL 59.70 08/10/2022   Lab Results  Component Value Date   LDLCALC 127 (H) 08/10/2022   Lab Results  Component Value Date   TRIG 109.0 08/10/2022   Lab Results  Component Value Date   CHOLHDL 3 08/10/2022   Lab Results  Component Value Date   HGBA1C 6.2 08/10/2022      Assessment & Plan:   Problem List Items Addressed This Visit   None    No orders of the defined types were placed in this encounter.    Follow-up: No follow-ups on file.    CTheresia Lo NP

## 2022-09-25 NOTE — Telephone Encounter (Signed)
Access Nurse states patient needs to be seen in the next four hours for rapid heart rate.  Patient is not having rapid heart rate right now.  Patient has been scheduled to see Theresia Lo, NP, at 3:20pm today.

## 2022-09-25 NOTE — Telephone Encounter (Signed)
Pt called stating she is having frequent rapid heart beats and the medication is not working. Pt stated when laying down its 190. Sent to access nurse

## 2022-09-25 NOTE — Telephone Encounter (Signed)
LMTCB

## 2022-09-25 NOTE — Telephone Encounter (Signed)
Pt called Jessica back. Unable to transfer call.

## 2022-09-29 DIAGNOSIS — R002 Palpitations: Secondary | ICD-10-CM | POA: Insufficient documentation

## 2022-09-29 NOTE — Assessment & Plan Note (Signed)
We will place her on Zio heart monitor for 14 days. Order placed for heart monitor. Instruction provided regarding heart monitor. Encouraged her to cut down on caffeine and alcohol. Continue metoprolol 50 mg and diltiazem 120 mg daily. Symptom discussed for emergency care and patient reported understanding.

## 2022-09-30 DIAGNOSIS — R002 Palpitations: Secondary | ICD-10-CM

## 2022-10-27 ENCOUNTER — Encounter: Payer: Self-pay | Admitting: Nurse Practitioner

## 2022-10-29 ENCOUNTER — Telehealth: Payer: Self-pay

## 2022-10-29 DIAGNOSIS — I471 Supraventricular tachycardia, unspecified: Secondary | ICD-10-CM

## 2022-10-29 NOTE — Telephone Encounter (Signed)
Anne Ingram from [place] monitor called to report an abnormal result. She stated that during the 14 days she wore the monitor she had 134 runs of SVT. She stated that some were symptomatic with trigger events.  Fastest was for 1 min 28 secs Next was 2 min 18 secs Longest was for 3 mins and 39 secs with an average HR of 167.

## 2022-10-30 NOTE — Telephone Encounter (Signed)
Lm for pt to cb.

## 2022-10-30 NOTE — Telephone Encounter (Signed)
Can you please call th patient with the result.  Please check with her did she receive a call from the [facility] monitor about her result and referral to cardiology?

## 2022-11-02 NOTE — Telephone Encounter (Signed)
I have sent email to [place] for information.

## 2022-11-02 NOTE — Telephone Encounter (Signed)
Lm for pt to cb   Please check with her did she receive a call from the [facility] monitor about her result and referral to cardiology?

## 2022-11-02 NOTE — Telephone Encounter (Signed)
We need to place cardiology referral called Zio and was advised referring provider would place cardiology referral.

## 2022-11-02 NOTE — Telephone Encounter (Signed)
I read patient message from [place] and her results.  Patient states she is returning our call.  Patient states she has not heard from [facility] monitor about her result and referral to cardiology.    I spoke with Adair Laundry, CMA.  Janett Billow states she has sent a message to Dr. Derrel Nip and Kerin Salen, RN, our practice administrator to clarify next step.  I relayed Jessica's message to patient.

## 2022-11-02 NOTE — Telephone Encounter (Signed)
Pt returning call to marisa 

## 2022-11-02 NOTE — Telephone Encounter (Signed)
Pt still has not heard from her Zio monitor or the referral to cardiology. Who am I supposed to contact?

## 2022-11-03 NOTE — Telephone Encounter (Signed)
Spoke with pt and she stated that she is going to see Dr. Quentin Ore tomorrow.

## 2022-11-03 NOTE — Telephone Encounter (Signed)
Patient states she would like to have Dr. Ida Rogue as her cardiologist.  Patient states she would like to speak directly with Dr. Deborra Medina.  I spoke with Adair Laundry, CMA, and she states that Dr. Lars Mage is the one who reads the Zio monitors, so patient needs to speak with him initially.  Janett Billow states she may talk with them about switching to Dr. Ida Rogue after that visit if necessary.  Janett Billow states she will call patient back today.  I relayed message to patient.

## 2022-11-03 NOTE — Telephone Encounter (Signed)
Left message for patient to return call to office please advise patient cardiology referral has been placed they should be calling to set up appointment to discuss Zio monitor.

## 2022-11-03 NOTE — Telephone Encounter (Signed)
Patient states she would like to have clarification regarding her appointment with Dr. Lars Mage tomorrow.  I gave clarification from the information in the notes from yesterday.  Patient was not sure where she would be going for appointment.  I googled Dr. Quentin Ore and it looks like his office is in [place], but patient believes she is coming to [place].  I called Dr. Mardene Speak office and found out that he works in both locations and will be in their [employer].  I relayed information to patient along with Dr. Mardene Speak address and phone number.

## 2022-11-03 NOTE — Telephone Encounter (Signed)
Patient scheduled to see Dr. Quentin Ore 11/04/22.

## 2022-11-03 NOTE — Progress Notes (Unsigned)
  Electrophysiology Office Note:    Date:  11/04/2022   ID:  [place], DOB July 28, 1956, MRN [id number]  [facility] Cardiologist:  None  [facility] Electrophysiologist:  Vickie Epley, MD   Referring MD: Crecencio Mc, MD   Chief Complaint: SVT  History of Present Illness:    Anne Ingram is a 67 y.o. female who I am seeing today for an evaluation of SVT at the request of Dr Toy Care. The patient was seen by Dr Toy Care 09/25/2022. She reported 28 years of rapid heart rates responsive to valsalva maneuvers.   She tells me her first symptoms were 28 years ago when she was pregnant.  She has had to go to the emergency department several times.  She has received adenosine in the past which was effective in terminating the arrhythmia.  For the longest time she would experience symptoms 1 time per year but recently the episodes become much more frequent and are causing significant anxiety.  These episodes are becoming more frequent in the setting of metoprolol daily use and diltiazem daily use.  She does not tolerate taking both diltiazem and metoprolol and feels lightheaded and dizzy occasionally.  She also uses the Valsalva maneuver to terminate the arrhythmia.       Their past medical, social and family history was reveiwed.   ROS:   Please see the history of present illness.    All other systems reviewed and are negative.  EKGs/Labs/Other Studies Reviewed:    The following studies were reviewed today:   11/02/2022 Zio HR 41 - 218 bpm, average 67 bpm. 134 SVT episodes, longest 3 min 39 seconds with an average rate of 167 bpm. Rare supraventricular and ventricular ectopy. Symptom trigger episodes correspond to SVT episodes. No atrial fibrillation detected.      Physical Exam:    VS:  BP (!) 148/82   Pulse 68   Ht 5\' 1"  (1.549 m)   Wt 165 lb (74.8 kg)   SpO2 98%   BMI 31.18 kg/m     Wt Readings from Last 3 Encounters:  11/04/22 165 lb (74.8 kg)  09/25/22 164  lb 3.2 oz (74.5 kg)  08/10/22 163 lb 3.2 oz (74 kg)     GEN:  Well nourished, well developed in no acute distress CARDIAC: RRR, no murmurs, rubs, gallops RESPIRATORY:  Clear to auscultation without rales, wheezing or rhonchi       ASSESSMENT AND PLAN:    1. Palpitation   2. SVT (supraventricular tachycardia)   3. Abnormal electrocardiogram (ECG) (EKG)     #SVT Symptomatic. Recurrent. Responsive to valsalva maneuvers.  Discussed treatment options for her SVT during today's visit including medications and EP study and ablation.  We discussed flecainide and nodal blockers during today's visit  Therapeutic strategies for supraventricular tachycardia including medicine and ablation were discussed in detail with the patient today. Risk, benefits, and alternatives to EP study and radiofrequency ablation were also discussed in detail today. These risks include but are not limited to stroke, bleeding, vascular damage, tamponade, perforation, damage to the heart and other structures, AV block requiring pacemaker, worsening renal function, and death. The patient understands these risk and wishes to proceed.  We will therefore proceed with catheter ablation at the next available time.     Signed, Hilton Cork. Quentin Ore, MD, [facility], [facility] 11/04/2022 4:10 PM    Electrophysiology [facility]

## 2022-11-04 ENCOUNTER — Ambulatory Visit: Payer: Medicare Other | Attending: Cardiology | Admitting: Cardiology

## 2022-11-04 ENCOUNTER — Encounter: Payer: Self-pay | Admitting: Cardiology

## 2022-11-04 VITALS — BP 148/82 | HR 68 | Ht 61.0 in | Wt 165.0 lb

## 2022-11-04 DIAGNOSIS — R9431 Abnormal electrocardiogram [ECG] [EKG]: Secondary | ICD-10-CM

## 2022-11-04 DIAGNOSIS — I471 Supraventricular tachycardia, unspecified: Secondary | ICD-10-CM | POA: Insufficient documentation

## 2022-11-04 DIAGNOSIS — R002 Palpitations: Secondary | ICD-10-CM | POA: Diagnosis present

## 2022-11-04 NOTE — Patient Instructions (Addendum)
Medication Instructions:  Your physician recommends that you continue on your current medications as directed. Please refer to the Current Medication list given to you today.  *If you need a refill on your cardiac medications before your next appointment, please call your pharmacy*  Lab Work: BMET and CBC on April 18th You will get your lab work at [facility]) hospital.  Your lab work will be done at [facility] next to [employer]. These are walk in labs- you will not need an appointment and you do not need to be fasting.    Testing/Procedures: Your physician has recommended that you have an EP study/ablation. Catheter ablation is a medical procedure used to treat some cardiac arrhythmias (irregular heartbeats). During catheter ablation, a long, thin, flexible tube is put into a blood vessel in your groin (upper thigh), or neck. This tube is called an ablation catheter. It is then guided to your heart through the blood vessel. Radio frequency waves destroy small areas of heart tissue where abnormal heartbeats may cause an arrhythmia to start. Please see the instruction sheet given to you today.   Follow-Up: At [facility], you and your health needs are our priority.  As part of our continuing mission to provide you with exceptional heart care, we have created designated Provider Care Teams.  These Care Teams include your primary Cardiologist (physician) and Advanced Practice Providers (APPs -  Physician Assistants and Nurse Practitioners) who all work together to provide you with the care you need, when you need it.  Your next appointment:   We will call you to schedule your follow up appointments.

## 2022-11-05 ENCOUNTER — Telehealth: Payer: Self-pay | Admitting: Cardiology

## 2022-11-05 NOTE — Telephone Encounter (Signed)
-----   Message from Bernestine Amass, RN sent at 11/04/2022  4:54 PM EDT ----- Hello! Dr. Quentin Ore saw this patient today and would like for her to have an echocardiogram done prior to her ablation which is going to be done on April 24th. Are we able to get this done here in [place] prior to then? If so could someone reach out to her to schedule?  Thanks so much! Carly

## 2022-11-05 NOTE — Telephone Encounter (Signed)
LMOV to schedule ECHO  

## 2022-12-10 ENCOUNTER — Other Ambulatory Visit
Admission: RE | Admit: 2022-12-10 | Discharge: 2022-12-10 | Disposition: A | Discharge status: Home / Self Care | Payer: Medicare Other | Attending: Cardiology | Admitting: Cardiology

## 2022-12-10 DIAGNOSIS — R002 Palpitations: Secondary | ICD-10-CM

## 2022-12-10 DIAGNOSIS — I471 Supraventricular tachycardia, unspecified: Secondary | ICD-10-CM

## 2022-12-10 LAB — BASIC METABOLIC PANEL
Anion gap: 7 (ref 5–15)
BUN: 19 mg/dL (ref 8–23)
CO2: 26 mmol/L (ref 22–32)
Calcium: 9.5 mg/dL (ref 8.9–10.3)
Chloride: 105 mmol/L (ref 98–111)
Creatinine, Ser: 0.76 mg/dL (ref 0.44–1.00)
GFR, Estimated: >60 mL/min (ref >60)
Glucose, Bld: 105 mg/dL — ABNORMAL HIGH (ref 70–99)
Potassium: 4.4 mmol/L (ref 3.5–5.1)
Sodium: 138 mmol/L (ref 135–145)

## 2022-12-10 LAB — CBC WITH DIFFERENTIAL/PLATELET
Abs Immature Granulocytes: 0.01 10*3/uL (ref 0.00–0.07)
Basophils Absolute: 0 10*3/uL (ref 0.0–0.1)
Basophils Relative: 0 %
Eosinophils Absolute: 0.2 10*3/uL (ref 0.0–0.5)
Eosinophils Relative: 2 %
HCT: 40.7 % (ref 36.0–46.0)
Hemoglobin: 13.4 g/dL (ref 12.0–15.0)
Immature Granulocytes: 0 %
Lymphocytes Relative: 51 %
Lymphs Abs: 3.7 10*3/uL (ref 0.7–4.0)
MCH: 29.3 pg (ref 26.0–34.0)
MCHC: 32.9 g/dL (ref 30.0–36.0)
MCV: 89.1 fL (ref 80.0–100.0)
Monocytes Absolute: 0.5 10*3/uL (ref 0.1–1.0)
Monocytes Relative: 7 %
Neutro Abs: 2.9 10*3/uL (ref 1.7–7.7)
Neutrophils Relative %: 40 %
Platelets: 269 10*3/uL (ref 150–400)
RBC: 4.57 MIL/uL (ref 3.87–5.11)
RDW: 12.1 % (ref 11.5–15.5)
WBC: 7.2 10*3/uL (ref 4.0–10.5)
nRBC: 0 % (ref 0.0–0.2)

## 2022-12-14 ENCOUNTER — Ambulatory Visit: Payer: Medicare Other | Attending: Cardiology

## 2022-12-14 DIAGNOSIS — R9431 Abnormal electrocardiogram [ECG] [EKG]: Secondary | ICD-10-CM | POA: Diagnosis present

## 2022-12-14 DIAGNOSIS — I471 Supraventricular tachycardia, unspecified: Secondary | ICD-10-CM | POA: Diagnosis present

## 2022-12-14 DIAGNOSIS — R002 Palpitations: Secondary | ICD-10-CM | POA: Insufficient documentation

## 2022-12-14 LAB — ECHOCARDIOGRAM COMPLETE
AR max vel: 2.72 cm2
AV Area VTI: 2.77 cm2
AV Area mean vel: 2.73 cm2
AV Mean grad: 2 mmHg
AV Peak grad: 3.5 mmHg
Ao pk vel: 0.93 m/s
Area-P 1/2: 3.72 cm2
Calc EF: 52.3 %
S' Lateral: 2.9 cm
Single Plane A2C EF: 44.6 %
Single Plane A4C EF: 56.2 %

## 2022-12-18 NOTE — Pre-Procedure Instructions (Signed)
Attempted to call patient regarding procedure instructions.  Left voicemail on the following items: Arrival time 1100 Nothing to eat or drink after midnight No meds AM of procedure Responsible person to drive you home and stay with you for 24 hrs  Last dose of Diltiazem on 4/23 Last dose of Metoprolol on 4/25

## 2022-12-21 ENCOUNTER — Encounter (HOSPITAL_COMMUNITY)
Admission: RE | Disposition: A | Discharge status: Home / Self Care | Payer: Self-pay | Source: Ambulatory Visit | Attending: Cardiology

## 2022-12-21 ENCOUNTER — Ambulatory Visit (HOSPITAL_COMMUNITY)
Admission: RE | Admit: 2022-12-21 | Discharge: 2022-12-21 | Disposition: A | Discharge status: Home / Self Care | Payer: Medicare Other | Source: Ambulatory Visit | Attending: Cardiology | Admitting: Cardiology

## 2022-12-21 ENCOUNTER — Ambulatory Visit (HOSPITAL_BASED_OUTPATIENT_CLINIC_OR_DEPARTMENT_OTHER): Payer: Medicare Other | Admitting: Anesthesiology

## 2022-12-21 ENCOUNTER — Ambulatory Visit (HOSPITAL_COMMUNITY): Payer: Medicare Other | Admitting: Anesthesiology

## 2022-12-21 DIAGNOSIS — I10 Essential (primary) hypertension: Secondary | ICD-10-CM | POA: Insufficient documentation

## 2022-12-21 DIAGNOSIS — Z87891 Personal history of nicotine dependence: Secondary | ICD-10-CM | POA: Insufficient documentation

## 2022-12-21 DIAGNOSIS — F419 Anxiety disorder, unspecified: Secondary | ICD-10-CM | POA: Insufficient documentation

## 2022-12-21 DIAGNOSIS — I471 Supraventricular tachycardia, unspecified: Secondary | ICD-10-CM

## 2022-12-21 HISTORY — PX: SVT ABLATION: EP1225

## 2022-12-21 SURGERY — SVT ABLATION
Anesthesia: Monitor Anesthesia Care

## 2022-12-21 MED ORDER — BUPIVACAINE HCL (PF) 0.25 % IJ SOLN
INTRAMUSCULAR | Status: DC | PRN
  Administered 2022-12-21: 30 mL

## 2022-12-21 MED ORDER — FENTANYL CITRATE (PF) 100 MCG/2ML IJ SOLN
INTRAMUSCULAR | Status: AC
  Filled 2022-12-21: qty 2

## 2022-12-21 MED ORDER — HEPARIN (PORCINE) IN NACL 1000-0.9 UT/500ML-% IV SOLN
INTRAVENOUS | Status: DC | PRN
  Administered 2022-12-21 (×2): 500 mL

## 2022-12-21 MED ORDER — ONDANSETRON HCL 4 MG/2ML IJ SOLN: 4.0000 mg | Freq: Four times a day (QID) | INTRAMUSCULAR | Status: DC | PRN

## 2022-12-21 MED ORDER — SODIUM CHLORIDE 0.9 % IV SOLN: INTRAVENOUS | Status: DC

## 2022-12-21 MED ORDER — PROPOFOL 500 MG/50ML IV EMUL
INTRAVENOUS | Status: DC | PRN
  Administered 2022-12-21: 50 ug/kg/min via INTRAVENOUS

## 2022-12-21 MED ORDER — SODIUM CHLORIDE 0.9% FLUSH: 3.0000 mL | INTRAVENOUS | Status: DC | PRN

## 2022-12-21 MED ORDER — SODIUM CHLORIDE 0.9 % IV SOLN: 250.0000 mL | INTRAVENOUS | Status: DC | PRN

## 2022-12-21 MED ORDER — ACETAMINOPHEN 325 MG PO TABS
650.0000 mg | ORAL_TABLET | ORAL | Status: DC | PRN
  Administered 2022-12-21: 650 mg via ORAL
  Filled 2022-12-21: qty 2

## 2022-12-21 MED ORDER — ISOPROTERENOL HCL 0.2 MG/ML IJ SOLN
INTRAMUSCULAR | Status: AC
  Filled 2022-12-21: qty 5

## 2022-12-21 MED ORDER — PROPOFOL 10 MG/ML IV BOLUS
INTRAVENOUS | Status: DC | PRN
  Administered 2022-12-21: 20 mg via INTRAVENOUS
  Administered 2022-12-21: 10 mg via INTRAVENOUS
  Administered 2022-12-21 (×2): 20 mg via INTRAVENOUS

## 2022-12-21 MED ORDER — FENTANYL CITRATE (PF) 100 MCG/2ML IJ SOLN
INTRAMUSCULAR | Status: DC | PRN
  Administered 2022-12-21 (×2): 25 ug via INTRAVENOUS

## 2022-12-21 MED ORDER — SODIUM CHLORIDE 0.9 % IV SOLN: INTRAVENOUS | Status: DC | PRN

## 2022-12-21 MED ORDER — FENTANYL CITRATE (PF) 100 MCG/2ML IJ SOLN: 25.0000 ug | INTRAMUSCULAR | Status: DC | PRN

## 2022-12-21 MED ORDER — SODIUM CHLORIDE 0.9% FLUSH: 3.0000 mL | Freq: Two times a day (BID) | INTRAVENOUS | Status: DC

## 2022-12-21 MED ORDER — ISOPROTERENOL HCL 0.2 MG/ML IJ SOLN
INTRAVENOUS | Status: DC | PRN
  Administered 2022-12-21: 2 ug/min via INTRAVENOUS

## 2022-12-21 SURGICAL SUPPLY — 15 items
BAG SNAP BAND KOVER 36X36 (MISCELLANEOUS) IMPLANT
CATH CRD2 QUAD 6FR REP (CATHETERS) IMPLANT
CATH DECANAV D CURVE (CATHETERS) IMPLANT
CATH JOSEPH QUAD ALLRED 6F REP (CATHETERS) IMPLANT
CATH SMTCH THERMOCOOL SF DF (CATHETERS) IMPLANT
CLOSURE PERCLOSE PROSTYLE (VASCULAR PRODUCTS) IMPLANT
PACK EP LATEX FREE (CUSTOM PROCEDURE TRAY) ×1
PACK EP LF (CUSTOM PROCEDURE TRAY) ×1 IMPLANT
PAD DEFIB RADIO PHYSIO CONN (PAD) ×1 IMPLANT
PATCH CARTO3 (PAD) IMPLANT
SHEATH CARTO VIZIGO SM CVD (SHEATH) IMPLANT
SHEATH PINNACLE 7F 10CM (SHEATH) IMPLANT
SHEATH PINNACLE 8F 10CM (SHEATH) IMPLANT
SHEATH PROBE COVER 6X72 (BAG) IMPLANT
TUBING SMART ABLATE COOLFLOW (TUBING) IMPLANT

## 2022-12-21 NOTE — Anesthesia Preprocedure Evaluation (Addendum)
Anesthesia Evaluation  Patient identified by MRN, date of birth, ID band Patient awake    Reviewed: Allergy & Precautions, NPO status , Patient's Chart, lab work & pertinent test results, reviewed documented beta blocker date and time   Airway Mallampati: II  TM Distance: >3 FB Neck ROM: Full    Dental no notable dental hx. (+) Teeth Intact, Dental Advisory Given   Pulmonary former smoker   Pulmonary exam normal breath sounds clear to auscultation       Cardiovascular hypertension, Pt. on home beta blockers and Pt. on medications Normal cardiovascular exam+ dysrhythmias Supra Ventricular Tachycardia  Rhythm:Regular Rate:Normal  TTE 2024 1. Left ventricular ejection fraction, by estimation, is 55 to 60%. The  left ventricle has normal function. The left ventricle has no regional  wall motion abnormalities. Left ventricular diastolic parameters were  normal.   2. Right ventricular systolic function is normal. The right ventricular  size is normal.   3. The mitral valve is normal in structure. Trivial mitral valve  regurgitation. No evidence of mitral stenosis.   4. The aortic valve is normal in structure. Aortic valve regurgitation is  not visualized. No aortic stenosis is present.   5. The inferior vena cava is normal in size with greater than 50%  respiratory variability, suggesting right atrial pressure of 3 mmHg.     Neuro/Psych negative neurological ROS  negative psych ROS   GI/Hepatic negative GI ROS, Neg liver ROS,,,  Endo/Other  negative endocrine ROS    Renal/GU negative Renal ROS  negative genitourinary   Musculoskeletal negative musculoskeletal ROS (+)    Abdominal   Peds  Hematology negative hematology ROS (+)   Anesthesia Other Findings   Reproductive/Obstetrics                             Anesthesia Physical Anesthesia Plan  ASA: 2  Anesthesia Plan: MAC   Post-op Pain  Management: Minimal or no pain anticipated   Induction: Intravenous  PONV Risk Score and Plan: 2 and Propofol infusion and Treatment may vary due to age or medical condition  Airway Management Planned: Natural Airway  Additional Equipment:   Intra-op Plan:   Post-operative Plan:   Informed Consent: I have reviewed the patients History and Physical, chart, labs and discussed the procedure including the risks, benefits and alternatives for the proposed anesthesia with the patient or authorized representative who has indicated his/her understanding and acceptance.     Dental advisory given  Plan Discussed with: CRNA  Anesthesia Plan Comments:        Anesthesia Quick Evaluation

## 2022-12-21 NOTE — H&P (Signed)
Electrophysiology Office Note:     Date:  12/21/2022    ID:  Anne Ingram, DOB November 25, 1955, MRN [id number]   [facility] Cardiologist:  None  [facility] Electrophysiologist:  Lanier Prude, MD    Referring MD: Sherlene Shams, MD    Chief Complaint: SVT   History of Present Illness:     Anne Ingram is a 67 y.o. female who I am seeing today for an evaluation of SVT at the request of Dr Evelene Croon. The patient was seen by Dr Evelene Croon 09/25/2022. She reported 28 years of rapid heart rates responsive to valsalva maneuvers.    She tells me her first symptoms were 28 years ago when she was pregnant.  She has had to go to the emergency department several times.  She has received adenosine in the past which was effective in terminating the arrhythmia.  For the longest time she would experience symptoms 1 time per year but recently the episodes become much more frequent and are causing significant anxiety.  These episodes are becoming more frequent in the setting of metoprolol daily use and diltiazem daily use.  She does not tolerate taking both diltiazem and metoprolol and feels lightheaded and dizzy occasionally.  She also uses the Valsalva maneuver to terminate the arrhythmia.     Today, she presents for her EP study and possible ablation.      Objective  Their past medical, social and family history was reveiwed.     ROS:   Please see the history of present illness.    All other systems reviewed and are negative.   EKGs/Labs/Other Studies Reviewed:     The following studies were reviewed today:     24-Nov-2022 Zio HR 41 - 218 bpm, average 67 bpm. 134 SVT episodes, longest 3 min 39 seconds with an average rate of 167 bpm. Rare supraventricular and ventricular ectopy. Symptom trigger episodes correspond to SVT episodes. No atrial fibrillation detected.           Physical Exam:     VS:  BP 125/71   Pulse 63   Ht 5\' 1"  (1.549 m)   Wt 165 lb (74.8 kg)   SpO2 98%   BMI 31.18  kg/m         Wt Readings from Last 3 Encounters:  11/04/22 165 lb (74.8 kg)  09/25/22 164 lb 3.2 oz (74.5 kg)  08/10/22 163 lb 3.2 oz (74 kg)      GEN:  Well nourished, well developed in no acute distress CARDIAC: RRR, no murmurs, rubs, gallops RESPIRATORY:  Clear to auscultation without rales, wheezing or rhonchi          Assessment ASSESSMENT AND PLAN:     1. Palpitation   2. SVT (supraventricular tachycardia)   3. Abnormal electrocardiogram (ECG) (EKG)       #SVT Symptomatic. Recurrent. Responsive to valsalva maneuvers.   Discussed treatment options for her SVT during today's visit including medications and EP study and ablation.  We discussed flecainide and nodal blockers during today's visit   Therapeutic strategies for supraventricular tachycardia including medicine and ablation were discussed in detail with the patient today. Risk, benefits, and alternatives to EP study and radiofrequency ablation were also discussed in detail today. These risks include but are not limited to stroke, bleeding, vascular damage, tamponade, perforation, damage to the heart and other structures, AV block requiring pacemaker, worsening renal function, and death. The patient understands these risk and wishes to proceed.  We  will therefore proceed with catheter ablation at the next available time.      Presents for EP study and possible SVT ablation. Procedure reviewed.   Signed, Rossie Muskrat. Lalla Brothers, MD, [facility], [facility] 12/21/2022 Electrophysiology [facility]

## 2022-12-21 NOTE — Discharge Instructions (Signed)

## 2022-12-21 NOTE — Transfer of Care (Signed)
Immediate Anesthesia Transfer of Care Note  Patient: Anne Ingram  Procedure(s) Performed: SVT ABLATION  Patient Location: PACU  Anesthesia Type:MAC  Level of Consciousness: drowsy and patient cooperative  Airway & Oxygen Therapy: Patient Spontanous Breathing and Patient connected to nasal cannula oxygen  Post-op Assessment: Report given to RN and Post -op Vital signs reviewed and stable  Post vital signs: Reviewed and stable  Last Vitals:  Vitals Value Taken Time  BP 130/90 12/21/22 1538  Temp    Pulse 52   Resp 19 12/21/22 1540  SpO2 95   Vitals shown include unvalidated device data.  Last Pain:  Vitals:   12/21/22 1227  TempSrc:   PainSc: 0-No pain         Complications: There were no known notable events for this encounter.

## 2022-12-22 ENCOUNTER — Encounter (HOSPITAL_COMMUNITY): Payer: Self-pay | Admitting: Cardiology

## 2022-12-22 NOTE — Anesthesia Postprocedure Evaluation (Signed)
Anesthesia Post Note  Patient: Anne Ingram  Procedure(s) Performed: SVT ABLATION     Patient location during evaluation: Cath Lab Anesthesia Type: MAC Level of consciousness: awake and alert Pain management: pain level controlled Vital Signs Assessment: post-procedure vital signs reviewed and stable Respiratory status: spontaneous breathing, nonlabored ventilation and respiratory function stable Cardiovascular status: stable and blood pressure returned to baseline Postop Assessment: no apparent nausea or vomiting Anesthetic complications: no   There were no known notable events for this encounter.  Last Vitals:  Vitals:   12/21/22 1905 12/21/22 1930  BP: (!) 159/70 121/62  Pulse: 74 65  Resp: 17 18  Temp:    SpO2:  95%    Last Pain:  Vitals:   12/21/22 1930  TempSrc:   PainSc: 0-No pain                 [place]

## 2023-01-19 NOTE — Progress Notes (Unsigned)
  Electrophysiology Office Follow up Visit Note:    Date:  01/20/2023   ID:  Anne Ingram, DOB 1956/07/17, MRN [id number]  PCP:  Sherlene Shams, MD  [facility] Cardiologist:  None  [facility] Electrophysiologist:  Lanier Prude, MD    Interval History:    Anne Ingram is a 67 y.o. female who presents for a follow up visit.   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 The slow pathway was successfully ablated during that procedure.  She reports no episodes of tachycardia since the procedure.  Prior to the procedure she was experiencing at least 3-4 episodes per month.  There is been a noticeable improvement in her rhythm after the procedure.  She is with her husband today in clinic who I previously met.       Past medical, surgical, social and family history were reviewed.  ROS:   Please see the history of present illness.    All other systems reviewed and are negative.  EKGs/Labs/Other Studies Reviewed:    The following studies were reviewed today:   EKG:  The ekg ordered today demonstrates sinus rhythm.  Low voltage QRS.  No preexcitation.   Physical Exam:    VS:  BP 122/80   Pulse (!) 57   Ht 5\' 2"  (1.575 m)   Wt 168 lb (76.2 kg)   SpO2 97%   BMI 30.73 kg/m     Wt Readings from Last 3 Encounters:  01/20/23 168 lb (76.2 kg)  12/21/22 160 lb (72.6 kg)  11/04/22 165 lb (74.8 kg)     GEN:  Well nourished, well developed in no acute distress CARDIAC: RRR, no murmurs, rubs, gallops RESPIRATORY:  Clear to auscultation without rales, wheezing or rhonchi       ASSESSMENT:    1. SVT (supraventricular tachycardia)    PLAN:    In order of problems listed above:   #SVT #AVNRT Doing well after her 12/21/2022 ablation for AVNRT. Stop diltiazem. Continue metoprolol  Follow up PRN with EP.    Signed, Steffanie Dunn, MD, [facility], [facility] 01/20/2023 8:01 PM    Electrophysiology [facility]

## 2023-01-20 ENCOUNTER — Encounter: Payer: Self-pay | Admitting: Cardiology

## 2023-01-20 ENCOUNTER — Ambulatory Visit: Payer: Medicare Other | Attending: Cardiology | Admitting: Cardiology

## 2023-01-20 VITALS — BP 122/80 | HR 57 | Ht 62.0 in | Wt 168.0 lb

## 2023-01-20 DIAGNOSIS — I471 Supraventricular tachycardia, unspecified: Secondary | ICD-10-CM | POA: Diagnosis present

## 2023-01-20 NOTE — Patient Instructions (Signed)
Medication Instructions:  Your physician has recommended you make the following change in your medication:  1) STOP taking diltiazem   *If you need a refill on your cardiac medications before your next appointment, please call your pharmacy*  Follow-Up: At [facility], you and your health needs are our priority.  As part of our continuing mission to provide you with exceptional heart care, we have created designated Provider Care Teams.  These Care Teams include your primary Cardiologist (physician) and Advanced Practice Providers (APPs -  Physician Assistants and Nurse Practitioners) who all work together to provide you with the care you need, when you need it.   Your next appointment:   1 year(s)  Provider:   Sherie Don, NP

## 2023-02-03 ENCOUNTER — Other Ambulatory Visit: Payer: Self-pay | Admitting: Internal Medicine

## 2023-02-03 NOTE — Telephone Encounter (Signed)
Medication was discontinued on 01/20/2023. Is it okay to refuse?

## 2023-06-07 ENCOUNTER — Other Ambulatory Visit: Payer: Self-pay | Admitting: Internal Medicine

## 2023-06-07 DIAGNOSIS — M8580 Other specified disorders of bone density and structure, unspecified site: Secondary | ICD-10-CM

## 2023-06-07 DIAGNOSIS — Z780 Asymptomatic menopausal state: Secondary | ICD-10-CM

## 2023-06-26 ENCOUNTER — Other Ambulatory Visit: Payer: Self-pay | Admitting: Internal Medicine

## 2023-06-26 DIAGNOSIS — I498 Other specified cardiac arrhythmias: Secondary | ICD-10-CM

## 2023-06-29 ENCOUNTER — Ambulatory Visit
Admission: RE | Admit: 2023-06-29 | Discharge: 2023-06-29 | Disposition: A | Discharge status: Home / Self Care | Payer: Medicare Other | Source: Ambulatory Visit | Attending: Internal Medicine | Admitting: Internal Medicine

## 2023-06-29 DIAGNOSIS — Z1231 Encounter for screening mammogram for malignant neoplasm of breast: Secondary | ICD-10-CM | POA: Insufficient documentation

## 2023-08-02 ENCOUNTER — Telehealth: Payer: Self-pay | Admitting: Internal Medicine

## 2023-08-02 NOTE — Telephone Encounter (Signed)
Copied from CRM [id number]. Topic: Medicare AWV >> Aug 02, 2023 11:27 AM Payton Doughty wrote: Reason for CRM: Called LVM 08/02/2023 to schedule Annual Wellness Visit  Verlee Rossetti; Care Guide Ambulatory Clinical Support [place] [facility] Direct Dial: [phone]

## 2023-08-11 ENCOUNTER — Encounter: Payer: Self-pay | Admitting: Internal Medicine

## 2023-08-11 ENCOUNTER — Ambulatory Visit: Payer: Medicare Other | Admitting: Internal Medicine

## 2023-08-11 VITALS — BP 124/78 | HR 70 | Ht 62.0 in | Wt 163.4 lb

## 2023-08-11 DIAGNOSIS — E782 Mixed hyperlipidemia: Secondary | ICD-10-CM | POA: Diagnosis not present

## 2023-08-11 DIAGNOSIS — R195 Other fecal abnormalities: Secondary | ICD-10-CM | POA: Diagnosis not present

## 2023-08-11 DIAGNOSIS — I498 Other specified cardiac arrhythmias: Secondary | ICD-10-CM

## 2023-08-11 DIAGNOSIS — Z79899 Other long term (current) drug therapy: Secondary | ICD-10-CM | POA: Diagnosis not present

## 2023-08-11 DIAGNOSIS — Z1211 Encounter for screening for malignant neoplasm of colon: Secondary | ICD-10-CM

## 2023-08-11 DIAGNOSIS — Z0000 Encounter for general adult medical examination without abnormal findings: Secondary | ICD-10-CM

## 2023-08-11 DIAGNOSIS — R7303 Prediabetes: Secondary | ICD-10-CM | POA: Diagnosis not present

## 2023-08-11 DIAGNOSIS — I10 Essential (primary) hypertension: Secondary | ICD-10-CM

## 2023-08-11 LAB — CBC WITH DIFFERENTIAL/PLATELET
Basophils Absolute: 0 10*3/uL (ref 0.0–0.1)
Basophils Relative: 0.6 % (ref 0.0–3.0)
Eosinophils Absolute: 0.1 10*3/uL (ref 0.0–0.7)
Eosinophils Relative: 1.8 % (ref 0.0–5.0)
HCT: 42.2 % (ref 36.0–46.0)
Hemoglobin: 13.9 g/dL (ref 12.0–15.0)
Lymphocytes Relative: 39 % (ref 12.0–46.0)
Lymphs Abs: 2.8 10*3/uL (ref 0.7–4.0)
MCHC: 33 g/dL (ref 30.0–36.0)
MCV: 90 fL (ref 78.0–100.0)
Monocytes Absolute: 0.4 10*3/uL (ref 0.1–1.0)
Monocytes Relative: 5.1 % (ref 3.0–12.0)
Neutro Abs: 3.9 10*3/uL (ref 1.4–7.7)
Neutrophils Relative %: 53.5 % (ref 43.0–77.0)
Platelets: 289 10*3/uL (ref 150.0–400.0)
RBC: 4.7 Mil/uL (ref 3.87–5.11)
RDW: 13 % (ref 11.5–15.5)
WBC: 7.3 10*3/uL (ref 4.0–10.5)

## 2023-08-11 LAB — TSH: TSH: 1.37 u[IU]/mL (ref 0.35–5.50)

## 2023-08-11 LAB — COMPREHENSIVE METABOLIC PANEL
ALT: 15 U/L (ref 0–35)
AST: 16 U/L (ref 0–37)
Albumin: 4.5 g/dL (ref 3.5–5.2)
Alkaline Phosphatase: 65 U/L (ref 39–117)
BUN: 14 mg/dL (ref 6–23)
CO2: 27 meq/L (ref 19–32)
Calcium: 9.7 mg/dL (ref 8.4–10.5)
Chloride: 104 meq/L (ref 96–112)
Creatinine, Ser: 0.74 mg/dL (ref 0.40–1.20)
GFR: 84 mL/min (ref >60.00)
Glucose, Bld: 97 mg/dL (ref 70–99)
Potassium: 4.4 meq/L (ref 3.5–5.1)
Sodium: 139 meq/L (ref 135–145)
Total Bilirubin: 0.4 mg/dL (ref 0.2–1.2)
Total Protein: 7.1 g/dL (ref 6.0–8.3)

## 2023-08-11 LAB — LIPID PANEL
Cholesterol: 209 mg/dL — ABNORMAL HIGH (ref 0–200)
HDL: 58.7 mg/dL (ref >39.00)
LDL Cholesterol: 132 mg/dL — ABNORMAL HIGH (ref 0–99)
NonHDL: 149.83
Total CHOL/HDL Ratio: 4
Triglycerides: 88 mg/dL (ref 0.0–149.0)
VLDL: 17.6 mg/dL (ref 0.0–40.0)

## 2023-08-11 LAB — HEMOGLOBIN A1C: Hgb A1c MFr Bld: 6.2 % (ref 4.6–6.5)

## 2023-08-11 LAB — LDL CHOLESTEROL, DIRECT: Direct LDL: 144 mg/dL

## 2023-08-11 MED ORDER — ESTRADIOL 0.1 MG/GM VA CREA: TOPICAL_CREAM | VAGINAL | 0 refills | Status: AC

## 2023-08-11 NOTE — Assessment & Plan Note (Signed)
She has undergone ablation for SVT in April 2024.  Continue metoprolol

## 2023-08-11 NOTE — Assessment & Plan Note (Signed)
She has no history of hypertension but takes metoprolol for management of atrial arrhythmia.   Lab Results  Component Value Date   CREATININE 0.76 12/10/2022   Lab Results  Component Value Date   NA 138 12/10/2022   K 4.4 12/10/2022   CL 105 12/10/2022   CO2 26 12/10/2022   Lab Results  Component Value Date   MICROALBUR <0.7 04/22/2020

## 2023-08-11 NOTE — Progress Notes (Unsigned)
Patient ID: Anne Ingram, female    DOB: 1956/01/24  Age: 67 y.o. MRN: [id number]  The patient is here for follow up and management of other chronic and acute problems.   The risk factors are reflected in the social history.   The roster of all physicians providing medical care to patient - is listed in the Snapshot section of the chart.   Activities of daily living:  The patient is 100% independent in all ADLs: dressing, toileting, feeding as well as independent mobility   Home safety : The patient has smoke detectors in the home. They wear seatbelts.  There are no unsecured firearms at home. There is no violence in the home.    There is no risks for hepatitis, STDs or HIV. There is no   history of blood transfusion. They have no travel history to infectious disease endemic areas of the world.   The patient has seen their dentist in the last six month. They have seen their eye doctor in the last year. The patinet  denies slight hearing difficulty with regard to whispered voices and some television programs.  They have deferred audiologic testing in the last year.  They do not  have excessive sun exposure. Discussed the need for sun protection: hats, long sleeves and use of sunscreen if there is significant sun exposure.    Diet: the importance of a healthy diet is discussed. They do have a healthy diet.   The benefits of regular aerobic exercise were discussed. The patient  walks 3.5 miles daily for exercise .    Depression screen: there are no signs or vegative symptoms of depression- irritability, change in appetite, anhedonia, sadness/tearfullness.   The following portions of the patient's history were reviewed and updated as appropriate: allergies, current medications, past family history, past medical history,  past surgical history, past social history  and problem list.   Visual acuity was not assessed per patient preference since the patient has regular follow up with an   ophthalmologist. Hearing and body mass index were assessed and reviewed.    During the course of the visit the patient was educated and counseled about appropriate screening and preventive services including : fall prevention , diabetes screening, nutrition counseling, colorectal cancer screening, and recommended immunizations.    Chief Complaint:  Recurrent episodes of anxiety/near panic.  The episodes are not provoked by emotional stressors,  caffeine,  exercise of menopausal flushing,  no chest pain,  denies shortness of breath.  Had ablation in April,  still taking metoprolol daily but has not had any runs of SVT since the ablation.    Review of Symptoms  Patient denies headache, fevers, malaise, unintentional weight loss, skin rash, eye pain, sinus congestion and sinus pain, sore throat, dysphagia,  hemoptysis , cough, dyspnea, wheezing, chest pain, palpitations, orthopnea, edema, abdominal pain, nausea, melena, diarrhea, constipation, flank pain, dysuria, hematuria, urinary  Frequency, nocturia, numbness, tingling, seizures,  Focal weakness, Loss of consciousness,  Tremor, insomnia, depression,  and suicidal ideation.    Physical Exam:  BP 124/78   Pulse 70   Ht 5\' 2"  (1.575 m)   Wt 163 lb 6.4 oz (74.1 kg)   SpO2 97%   BMI 29.89 kg/m    Physical Exam Vitals reviewed.  Constitutional:      General: She is not in acute distress.    Appearance: Normal appearance. She is normal weight. She is not ill-appearing, toxic-appearing or diaphoretic.  HENT:     Head: Normocephalic.  Eyes:     General: No scleral icterus.       Right eye: No discharge.        Left eye: No discharge.     Conjunctiva/sclera: Conjunctivae normal.  Cardiovascular:     Rate and Rhythm: Normal rate and regular rhythm.     Heart sounds: Normal heart sounds.  Pulmonary:     Effort: Pulmonary effort is normal. No respiratory distress.     Breath sounds: Normal breath sounds.  Musculoskeletal:        General:  Normal range of motion.  Skin:    General: Skin is warm and dry.  Neurological:     General: No focal deficit present.     Mental Status: She is alert and oriented to person, place, and time. Mental status is at baseline.  Psychiatric:        Mood and Affect: Mood normal.        Behavior: Behavior normal.        Thought Content: Thought content normal.        Judgment: Judgment normal.   Assessment and Plan: Colon cancer screening  Mixed hyperlipidemia  Prediabetes  Long-term use of high-risk medication    No follow-ups on file.  Sherlene Shams, MD

## 2023-08-12 NOTE — Assessment & Plan Note (Signed)

## 2023-08-14 ENCOUNTER — Encounter: Payer: Self-pay | Admitting: Internal Medicine

## 2023-08-31 ENCOUNTER — Ambulatory Visit
Admission: RE | Admit: 2023-08-31 | Discharge: 2023-08-31 | Disposition: A | Discharge status: Home / Self Care | Payer: Medicare Other | Source: Ambulatory Visit | Attending: Internal Medicine | Admitting: Internal Medicine

## 2023-08-31 DIAGNOSIS — Z780 Asymptomatic menopausal state: Secondary | ICD-10-CM | POA: Insufficient documentation

## 2023-09-03 DIAGNOSIS — M8580 Other specified disorders of bone density and structure, unspecified site: Secondary | ICD-10-CM | POA: Insufficient documentation

## 2023-09-08 ENCOUNTER — Telehealth: Payer: Self-pay | Admitting: Internal Medicine

## 2023-09-08 NOTE — Telephone Encounter (Signed)
 Copied from CRM [id number]. Topic: Medicare AWV >> Sep 08, 2023  1:47 PM Juliana Ocean wrote: Reason for CRM: Called patient back to explain AWV 09/08/2023  Anne Ingram; Care Guide Ambulatory Clinical Support [place] [facility] Direct Dial: [phone]

## 2023-09-08 NOTE — Telephone Encounter (Signed)
 Copied from CRM [id number]. Topic: Appointments - Appointment Info/Confirmation >> Sep 08, 2023 10:56 AM Isabell A wrote: Patient returning phone call from [facility] in regard to scheduling her AWV, patient states she was seen on 12/18 for her yearly follow up. Advised patient what the medicare AWV is, states she doesn't need to be seen so soon.

## 2023-09-08 NOTE — Telephone Encounter (Signed)
 Copied from CRM [id number]. Topic: Medicare AWV >> Sep 08, 2023 10:45 AM Juliana Ocean wrote: Reason for CRM: Called LVM 09/08/2023 to schedule AWV. Please schedule office or virtual visits.  Rosalee Collins; Care Guide Ambulatory Clinical Support [place] [facility] Direct Dial: [phone]

## 2023-09-15 ENCOUNTER — Ambulatory Visit: Payer: Medicare Other | Admitting: Cardiology

## 2023-09-25 NOTE — Progress Notes (Unsigned)
Electrophysiology Clinic Note    Date:  09/27/2023  Patient ID:  Anne Ingram, DOB 1956-05-31, MRN [id number] PCP:  Sherlene Shams, MD  Cardiologist:  None Electrophysiologist: Lanier Prude, MD   Discussed the use of AI scribe software for clinical note transcription with the patient, who gave verbal consent to proceed.   Patient Profile    Chief Complaint: palpitations  History of Present Illness: Anne Ingram is a 68 y.o. female with PMH notable for SVT, s/p AVNRT ablation, ; seen today for Lanier Prude, MD for acute visit due to palpitations.   She is s/p EPS w 11/2022 with induction of AVNRT where the slow pathway was successfully ablated. She last saw Dr. Lalla Brothers 12/2022 at which time she was doing well without recurrenct of tachycardia.   On follow-up today, she is having intermittent palpitations, described as 'blips.' These episodes are brief, typically lasting 2-3 minutes longest of which was about 15 minutes, and occur infrequently, with the last episode occurring about 2 weeks ago. The patient describes these episodes as less severe than the pre-ablation arrhythmias, which were incapacitating and lasted for hours. She is traveling to [place] soon for 3 week vacation and is nervous episodes will increase in duration and severity with the stress of traveling and sleep depravation.  She continues to take 50mg  toprol nightly.    she denies chest pain, dyspnea, PND, orthopnea, nausea, vomiting, dizziness, syncope, edema, weight gain, or early satiety.     AAD History: None     ROS:  Please see the history of present illness. All other systems are reviewed and otherwise negative.    Physical Exam    VS:  BP 134/74 (BP Location: Left Arm, Patient Position: Sitting, Cuff Size: Normal)   Pulse 61   Ht 5\' 2"  (1.575 m)   Wt 164 lb (74.4 kg)   SpO2 95%   BMI 30.00 kg/m  BMI: Body mass index is 30 kg/m.  Wt Readings from Last 3 Encounters:  09/27/23 164 lb  (74.4 kg)  08/11/23 163 lb 6.4 oz (74.1 kg)  01/20/23 168 lb (76.2 kg)     GEN- The patient is well appearing, alert and oriented x 3 today.   Lungs- Clear to ausculation bilaterally, normal work of breathing.  Heart- Regular rate and rhythm, no murmurs, rubs or gallops Extremities- No peripheral edema, warm, dry    Studies Reviewed   Previous EP, cardiology notes.    EKG is ordered. Personal review of EKG from today shows:    EKG Interpretation Date/Time:  Monday September 27 2023 12:57:03 EST Ventricular Rate:  61 PR Interval:  158 QRS Duration:  80 QT Interval:  422 QTC Calculation: 424 R Axis:   -5  Text Interpretation: Normal sinus rhythm Possible Left atrial enlargement Confirmed by Sherie Don [id number]) on 09/27/2023 1:04:47 PM    TTE, 12/14/2022  1. Left ventricular ejection fraction, by estimation, is 55 to 60%. The left ventricle has normal function. The left ventricle has no regional wall motion abnormalities. Left ventricular diastolic parameters were normal.   2. Right ventricular systolic function is normal. The right ventricular size is normal.   3. The mitral valve is normal in structure. Trivial mitral valve regurgitation. No evidence of mitral stenosis.   4. The aortic valve is normal in structure. Aortic valve regurgitation is not visualized. No aortic stenosis is present.   5. The inferior vena cava is normal in size with greater than  50% respiratory variability, suggesting right atrial pressure of 3 mmHg.      Assessment and Plan     #) SVT #) palpitations S/p AVNRT ablation Now with some palpitation recurrence, less severe than prior SVT episodes We discussed zio monitor, but given rarity of episodes do not favor at this time Continue toprol 50mg  daily Will start 12.5mg  lopressor PRN for tachycardia episodes lasting longer than 45 minutes Recommend to continue vagal maneuvers during episodes       Current medicines are reviewed at length with  the patient today.   The patient has concerns regarding her medicines.  The following changes were made today:   START 12.5mg  lopressor PRN for tachycardia  Labs/ tests ordered today include:  Orders Placed This Encounter  Procedures   EKG 12-Lead     Disposition: Follow up with Dr. Lalla Brothers or EP APP in 3 months   Signed, Sherie Don, NP  09/27/23  2:09 PM  Electrophysiology [facility]

## 2023-09-27 ENCOUNTER — Encounter: Payer: Self-pay | Admitting: Cardiology

## 2023-09-27 ENCOUNTER — Ambulatory Visit: Payer: Medicare Other | Attending: Cardiology | Admitting: Cardiology

## 2023-09-27 VITALS — BP 134/74 | HR 61 | Ht 62.0 in | Wt 164.0 lb

## 2023-09-27 DIAGNOSIS — R079 Chest pain, unspecified: Secondary | ICD-10-CM | POA: Diagnosis not present

## 2023-09-27 DIAGNOSIS — I471 Supraventricular tachycardia, unspecified: Secondary | ICD-10-CM | POA: Diagnosis not present

## 2023-09-27 MED ORDER — METOPROLOL TARTRATE 25 MG PO TABS: ORAL_TABLET | ORAL | 0 refills | Status: DC

## 2023-09-27 NOTE — Patient Instructions (Addendum)
Medication Instructions:  - Your physician has recommended you make the following change in your medication:   1) START Lopressor (metoprolol tartrate) 25 mg: - take 0.5 tablet (12.5 mg) by mouth twice daily (or every 12 hours) as needed for fast heart rates   *If you need a refill on your cardiac medications before your next appointment, please call your pharmacy*   Lab Work: - none ordered  If you have labs (blood work) drawn today and your tests are completely normal, you will receive your results only by: MyChart Message (if you have MyChart) OR A paper copy in the mail If you have any lab test that is abnormal or we need to change your treatment, we will call you to review the results.   Testing/Procedures: - none ordered   Follow-Up: At [facility], you and your health needs are our priority.  As part of our continuing mission to provide you with exceptional heart care, we have created designated Provider Care Teams.  These Care Teams include your primary Cardiologist (physician) and Advanced Practice Providers (APPs -  Physician Assistants and Nurse Practitioners) who all work together to provide you with the care you need, when you need it.  We recommend signing up for the patient portal called "MyChart".  Sign up information is provided on this After Visit Summary.  MyChart is used to connect with patients for Virtual Visits (Telemedicine).  Patients are able to view lab/test results, encounter notes, upcoming appointments, etc.  Non-urgent messages can be sent to your provider as well.   To learn more about what you can do with MyChart, go to [email or web address].    Your next appointment:   May 2025   Provider:   You may see Lanier Prude, MD or one of the following Advanced Practice Providers on your designated Care Team:    Sherie Don, NP  Other Instructions N/a

## 2023-10-06 ENCOUNTER — Ambulatory Visit: Payer: Medicare Other

## 2023-10-06 DIAGNOSIS — D124 Benign neoplasm of descending colon: Secondary | ICD-10-CM | POA: Diagnosis not present

## 2023-10-06 DIAGNOSIS — R195 Other fecal abnormalities: Secondary | ICD-10-CM | POA: Diagnosis not present

## 2023-10-06 DIAGNOSIS — Z1211 Encounter for screening for malignant neoplasm of colon: Secondary | ICD-10-CM | POA: Diagnosis present

## 2023-10-06 DIAGNOSIS — D123 Benign neoplasm of transverse colon: Secondary | ICD-10-CM | POA: Diagnosis not present

## 2023-10-06 DIAGNOSIS — D120 Benign neoplasm of cecum: Secondary | ICD-10-CM | POA: Diagnosis not present

## 2023-10-22 ENCOUNTER — Telehealth: Payer: Self-pay | Admitting: Internal Medicine

## 2023-10-22 NOTE — Telephone Encounter (Signed)
 Copied from CRM [id number]. Topic: Medicare AWV >> Oct 22, 2023  1:59 PM Payton Doughty wrote: Reason for CRM: Called LVM 10/22/2023 to schedule AWV. Please schedule office or virtual visits.  Verlee Rossetti; Care Guide Ambulatory Clinical Support [place] [facility] Direct Dial: [phone]

## 2023-11-22 ENCOUNTER — Encounter: Payer: Self-pay | Admitting: Internal Medicine

## 2023-11-23 ENCOUNTER — Telehealth: Payer: Self-pay

## 2023-11-23 ENCOUNTER — Telehealth: Admitting: Nurse Practitioner

## 2023-11-23 NOTE — Telephone Encounter (Signed)
 Patient states her arm is better today so she is declining an appointment at  this time.

## 2023-12-26 ENCOUNTER — Other Ambulatory Visit: Payer: Self-pay | Admitting: Cardiology

## 2023-12-31 ENCOUNTER — Ambulatory Visit: Admitting: Cardiology

## 2024-02-15 ENCOUNTER — Encounter: Payer: Self-pay | Admitting: Internal Medicine

## 2024-02-15 DIAGNOSIS — L409 Psoriasis, unspecified: Secondary | ICD-10-CM

## 2024-02-15 NOTE — Telephone Encounter (Unsigned)
 Copied from CRM [id number]. Topic: Appointments - Appointment Scheduling >> Feb 15, 2024  2:01 PM Berneda FALCON wrote: Patient states she thinks she may have psoriasis on her scalp. States she has itchy spots that are raised up behind her ear. She scheduled an appt for the soonest with Tullo on 8/5 but wants to know if she can have something called in, in the mean time please.  This is her preferred pharmacy:  [facility] [id number] [place], [place] - [address] [place] [address] Phone: [phone] Fax: [phone] Hours: Not open 24 hours  Patient callback is [phone]

## 2024-02-15 NOTE — Telephone Encounter (Signed)
 LMTCB. Pt will need to schedule an appt to be evaluated.

## 2024-02-16 MED ORDER — CALCIPOTRIENE 0.005 % EX CREA: TOPICAL_CREAM | Freq: Two times a day (BID) | CUTANEOUS | 2 refills | Status: DC

## 2024-02-16 NOTE — Addendum Note (Signed)
 Addended by: MARYLYNN VERNEITA CROME on: 02/16/2024 04:54 PM   Modules accepted: Orders

## 2024-02-17 MED ORDER — CALCIPOTRIENE 0.005 % EX CREA: TOPICAL_CREAM | Freq: Two times a day (BID) | CUTANEOUS | 2 refills | Status: DC

## 2024-02-17 NOTE — Telephone Encounter (Signed)
 Copied from CRM [id number]. Topic: Clinical - Prescription Issue >> Feb 17, 2024 11:38 AM Viola FALCON wrote: Reason for CRM: Patient says the [facility] that the calcipotriene (DOVONOX) 0.005 % cream was sent to needs authorization since it was sent to the [facility] on [address]. Please call [facility] then call patient with an update at  [phone] (M)

## 2024-02-17 NOTE — Telephone Encounter (Signed)
 PA is needed for the calcipotriene cream

## 2024-02-17 NOTE — Addendum Note (Signed)
 Addended by: HARRIETTE RAISIN on: 02/17/2024 09:04 AM   Modules accepted: Orders

## 2024-02-18 ENCOUNTER — Telehealth: Payer: Self-pay

## 2024-02-18 ENCOUNTER — Other Ambulatory Visit (HOSPITAL_COMMUNITY): Payer: Self-pay

## 2024-02-18 NOTE — Telephone Encounter (Signed)
 Pharmacy Patient Advocate Encounter   Received notification from Patient Advice Request messages that prior authorization for calcipotriene  (DOVONOX) 0.005 % cream  is required/requested.   Insurance verification completed.   The patient is insured through [employer] .   Per test claim: The current 30 day co-pay is, $97.90.  No PA needed at this time. This test claim was processed through [facility]- copay amounts may vary at other pharmacies due to pharmacy/plan contracts, or as the patient moves through the different stages of their insurance plan.       Called pharmacy, prescription did go through insurance but was over $200 there. Patient could use discount card and it was around $86. No PA needed.

## 2024-02-18 NOTE — Telephone Encounter (Signed)
 Copied from CRM [id number]. Topic: Clinical - Prescription Issue >> Feb 17, 2024 11:38 AM Anne Ingram wrote: Reason for CRM: Patient says the [facility] that the calcipotriene  (DOVONOX) 0.005 % cream was sent to needs authorization since it was sent to the [facility] on [address]. Please call [facility] then call patient with an update at  [phone] (M) >> Feb 18, 2024 11:04 AM Anne Ingram wrote: Patient is checking on the status of the prior auth for the cream, patient is asking if this can be done today as she can't go all weekend with the itching. Please provide the patient an update for the prior auth when available. Informed patient it was sent to the prior auth team.

## 2024-03-27 ENCOUNTER — Other Ambulatory Visit: Payer: Self-pay | Admitting: Internal Medicine

## 2024-03-27 ENCOUNTER — Encounter: Payer: Self-pay | Admitting: Internal Medicine

## 2024-03-27 DIAGNOSIS — I471 Supraventricular tachycardia, unspecified: Secondary | ICD-10-CM | POA: Insufficient documentation

## 2024-03-27 DIAGNOSIS — I498 Other specified cardiac arrhythmias: Secondary | ICD-10-CM

## 2024-03-28 ENCOUNTER — Ambulatory Visit: Admitting: Internal Medicine

## 2024-03-31 ENCOUNTER — Other Ambulatory Visit: Payer: Self-pay | Admitting: Internal Medicine

## 2024-03-31 DIAGNOSIS — I498 Other specified cardiac arrhythmias: Secondary | ICD-10-CM

## 2024-04-17 ENCOUNTER — Ambulatory Visit: Payer: Self-pay

## 2024-04-17 NOTE — Telephone Encounter (Signed)
 FYI Only or Action Required?: Action required by provider: request for appointment.  Patient was last seen in primary care on 08/11/2023 by Marylynn Verneita CROME, MD.  Called Nurse Triage reporting Pain.  Symptoms began several weeks ago.  Interventions attempted: Rest, hydration, or home remedies.  Symptoms are: gradually worsening.Low back pain, down buttock and left leg. Advil not helping. Took a friends Tramadol  that did help. Appointment tomorrow with NP, but asking to be worked in with PCP tomorrow if possible.  Triage Disposition: See PCP When Office is Open (Within 3 Days)  Patient/caregiver understands and will follow disposition?: Yes  Reason for Disposition  [1] Pain radiates into the thigh or further down the leg AND [2] one leg  Answer Assessment - Initial Assessment Questions 1. ONSET: When did the pain begin? (e.g., minutes, hours, days)     2 weeks ago 2. LOCATION: Where does it hurt? (upper, mid or lower back)     Left side, buttock and down leg 3. SEVERITY: How bad is the pain?  (e.g., Scale 1-10; mild, moderate, or severe)     8-9 4. PATTERN: Is the pain constant? (e.g., yes, no; constant, intermittent)      Comes and goes 5. RADIATION: Does the pain shoot into your legs or somewhere else?     yes 6. CAUSE:  What do you think is causing the back pain?      unsure 7. BACK OVERUSE:  Any recent lifting of heavy objects, strenuous work or exercise?     no 8. MEDICINES: What have you taken so far for the pain? (e.g., nothing, acetaminophen , NSAIDS)     Advil, Tramadol  9. NEUROLOGIC SYMPTOMS: Do you have any weakness, numbness, or problems with bowel/bladder control?     no 10. OTHER SYMPTOMS: Do you have any other symptoms? (e.g., fever, abdomen pain, burning with urination, blood in urine)       no 11. PREGNANCY: Is there any chance you are pregnant? When was your last menstrual period?       no  Protocols used: Back Pain-A-AH

## 2024-04-17 NOTE — Telephone Encounter (Signed)
 1st attempt. lvmtcb   Message from [facility] C sent at 04/17/2024 11:00 AM EDT  Summary: Sciatica- left side   Reason for CRM: Sciatica- left side  Patient went online to get the exercises online and is taking advil.  Patient was given tramadol  from sister in law over the weekend. Patient took tramadol  and within an hour, the pain was gone. Patient is inquiring if Dr. Tullo can put in a prescription for Tramadol  because the advil is not working at all.    [phone] (H)

## 2024-04-18 ENCOUNTER — Ambulatory Visit (INDEPENDENT_AMBULATORY_CARE_PROVIDER_SITE_OTHER): Admitting: Family

## 2024-04-18 ENCOUNTER — Ambulatory Visit

## 2024-04-18 ENCOUNTER — Encounter: Payer: Self-pay | Admitting: Family

## 2024-04-18 ENCOUNTER — Telehealth: Payer: Self-pay

## 2024-04-18 VITALS — BP 130/78 | HR 60 | Temp 97.8°F | Resp 20 | Ht 62.0 in | Wt 162.0 lb

## 2024-04-18 DIAGNOSIS — M5417 Radiculopathy, lumbosacral region: Secondary | ICD-10-CM | POA: Diagnosis not present

## 2024-04-18 MED ORDER — TRAMADOL HCL 50 MG PO TABS: 50.0000 mg | ORAL_TABLET | Freq: Three times a day (TID) | ORAL | 0 refills | Status: AC | PRN

## 2024-04-18 MED ORDER — METHYLPREDNISOLONE 4 MG PO TBPK
ORAL_TABLET | ORAL | 0 refills | Status: DC
Start: 2024-04-18 — End: 2024-05-26

## 2024-04-18 NOTE — Telephone Encounter (Signed)
 Copied from CRM #[id number]. 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  traMADol  (ULTRAM ) 50 MG tablet  [id number] option 0

## 2024-04-19 NOTE — Progress Notes (Signed)
 Acute Office Visit  Subjective:     Patient ID: Anne Ingram, female    DOB: [birth year], 68 y.o.   MRN: [id number]  Chief Complaint  Patient presents with  . Back Pain    Hurts from back down to the leg X 3 weeks    HPI Patient is in today with c/o low back pain that radiates down the left leg x 3 weeks that worsened after being in a 14 hour car ride. She continues to have pain down her left leg. Pain is worse with lying down. She reports the pain being a 9/10 at times. He sister in law gave her Tramadol  that helped. She has also tried Advil and back exercises that have not helped. No acute injury.  Review of Systems  Respiratory: Negative.    Cardiovascular: Negative.   Musculoskeletal:  Positive for back pain.       Pain radiates down the left leg.  Neurological:  Negative for dizziness and weakness.  Psychiatric/Behavioral: Negative.    All other systems reviewed and are negative.  Past Medical History:  Diagnosis Date  . Allergy Baby   Alkergic to penicillin  . Cardiac arrhythmia due to congenital heart disease   . Chicken pox   . Elevated blood pressure reading   . Hypertension 03/2020    Social History   Socioeconomic History  . Marital status: Married    Spouse name: Not on file  . Number of children: Not on file  . Years of education: Not on file  . Highest education level: Not on file  Occupational History  . Not on file  Tobacco Use  . Smoking status: Former  . Smokeless tobacco: Never  Substance and Sexual Activity  . Alcohol use: Yes    Alcohol/week: 5.0 standard drinks of alcohol    Types: 5 Standard drinks or equivalent per week    Comment: rarely  . Drug use: No  . Sexual activity: Not on file  Other Topics Concern  . Not on file  Social History Narrative  . Not on file   Social Drivers of Health   Financial Resource Strain: Low Risk  (08/31/2023)   Received from [facility]   Overall Financial Resource Strain (CARDIA)    . Difficulty of Paying Living Expenses: Not hard at all  Food Insecurity: No Food Insecurity (08/31/2023)   Received from [facility]   Hunger Vital Sign   . Within the past 12 months, you worried that your food would run out before you got the money to buy more.: Never true   . Within the past 12 months, the food you bought just didn't last and you didn't have money to get more.: Never true  Transportation Needs: No Transportation Needs (08/31/2023)   Received from [facility]   [facility] - Transportation   . In the past 12 months, has lack of transportation kept you from medical appointments or from getting medications?: No   . Lack of Transportation (Non-Medical): No  Physical Activity: Not on file  Stress: Not on file  Social Connections: Not on file  Intimate Partner Violence: Not on file    Past Surgical History:  Procedure Laterality Date  . CESAREAN SECTION  [phone]  . SVT ABLATION N/A 12/21/2022   Procedure: SVT ABLATION;  Surgeon: Cindie Ole DASEN, MD;  Location: [facility] INVASIVE CV LAB;  Service: Cardiovascular;  Laterality: N/A;  . TUBAL LIGATION  1995  Family History  Problem Relation Age of Onset  . Hypertension Mother   . Mental illness Mother   . Cancer Neg Hx   . Breast cancer Neg Hx     Allergies  Allergen Reactions  . Penicillins     Unknown childhood reaction     Current Outpatient Medications on File Prior to Visit  Medication Sig Dispense Refill  . calcipotriene  (DOVONOX) 0.005 % cream Apply topically 2 (two) times daily. To  psoriatic rash on scalp 60 g 2  . Calcium Carbonate-Vit D-Min (CALCIUM 600+D PLUS MINERALS PO) Take 1 tablet by mouth 2 (two) times a week.    . estradiol  (ESTRACE ) 0.1 MG/GM vaginal cream INSERT ONE APPLICATORFUL VAGINALLY 2 TIMES A WEEK 127.5 g 0  . ibuprofen (ADVIL) 200 MG tablet Take 200-400 mg by mouth every 6 (six) hours as needed for moderate pain.    . metoprolol  succinate (TOPROL -XL)  50 MG 24 hr tablet TAKE 1 TABLET BY MOUTH DAILY WITH A MEAL OR IMMEDIATELY FOLLOWING A MEAL 90 tablet 2  . metoprolol  tartrate (LOPRESSOR ) 25 MG tablet TAKE A HALF TABLET BY MOUTH 2 TIMES A DAY AS NEEDED 90 tablet 3   No current facility-administered medications on file prior to visit.    BP 130/78   Pulse 60   Temp 97.8 F (36.6 C)   Resp 20   Ht 5' 2 (1.575 m)   Wt 162 lb (73.5 kg)   SpO2 98%   BMI 29.63 kg/m chart      Objective:    BP 130/78   Pulse 60   Temp 97.8 F (36.6 C)   Resp 20   Ht 5' 2 (1.575 m)   Wt 162 lb (73.5 kg)   SpO2 98%   BMI 29.63 kg/m    Physical Exam Vitals reviewed.  Constitutional:      Appearance: Normal appearance. She is normal weight.  Cardiovascular:     Rate and Rhythm: Normal rate and regular rhythm.  Pulmonary:     Effort: Pulmonary effort is normal.     Breath sounds: Normal breath sounds.  Abdominal:     General: Abdomen is flat. Bowel sounds are normal.     Palpations: Abdomen is soft.  Musculoskeletal:        General: No swelling, tenderness, deformity or signs of injury.     Right lower leg: No edema.     Left lower leg: No edema.     Comments: Pain in the supine position. Negative SLR. No pain with abduction or adduction of the extremity.   Skin:    General: Skin is warm and dry.  Neurological:     General: No focal deficit present.     Mental Status: She is alert and oriented to person, place, and time. Mental status is at baseline.     Gait: Gait abnormal.     Deep Tendon Reflexes: Reflexes normal.  Psychiatric:        Mood and Affect: Mood normal.        Behavior: Behavior normal.    No results found for any visits on 04/18/24.      Assessment & Plan:   Problem List Items Addressed This Visit   None Visit Diagnoses       Lumbosacral radiculopathy    -  Primary   Relevant Orders   DG Lumbar Spine Complete       Meds ordered this encounter  Medications  . traMADol  (ULTRAM ) 50 MG tablet  Sig:  Take 1 tablet (50 mg total) by mouth every 8 (eight) hours as needed for up to 5 days.    Dispense:  15 tablet    Refill:  0  . methylPREDNISolone  (MEDROL  DOSEPAK) 4 MG TBPK tablet    Sig: As directed    Dispense:  21 tablet    Refill:  0   Call the office if symptoms worsen or persist. Will follow-up pending xray and sooner as needed. Will refer if necessary. No follow-ups on file.  Oasis Goehring B Aaira Oestreicher, FNP

## 2024-04-19 NOTE — Telephone Encounter (Signed)
 Called the pharmacy back and they stated that pt has already picked up the medication.

## 2024-04-26 ENCOUNTER — Telehealth: Payer: Self-pay

## 2024-04-26 NOTE — Telephone Encounter (Signed)
 Pt seen Padonda for this issue on 04/18/2024.   Copied from CRM [id number]. Topic: Clinical - Lab/Test Results >> Apr 26, 2024 10:13 AM Dedra B wrote: Reason for CRM: Pt called in regarding results from x-ray she had last week. She finished the prednisone  and it helped her sciatica but it's still there. She wants to know what are the next steps or how she needs to proceed.

## 2024-04-26 NOTE — Telephone Encounter (Signed)
 Attempted to call the reading room. No answer the phone kept ringing.

## 2024-04-27 ENCOUNTER — Ambulatory Visit: Payer: Self-pay | Admitting: Family

## 2024-04-27 ENCOUNTER — Other Ambulatory Visit: Payer: Self-pay | Admitting: Family

## 2024-04-27 ENCOUNTER — Ambulatory Visit: Payer: Self-pay

## 2024-04-27 MED ORDER — TRAMADOL HCL 50 MG PO TABS: 50.0000 mg | ORAL_TABLET | Freq: Three times a day (TID) | ORAL | 0 refills | Status: AC | PRN

## 2024-04-27 NOTE — Telephone Encounter (Signed)
 Spoke with pt and she stated that she is not having any of the symptoms mentioned in Margaret's note. I did call the reading room this morning and they are moving up the xray so it can be read today.

## 2024-04-27 NOTE — Telephone Encounter (Signed)
 FYI Only or Action Required?: Action required by provider: request for appointment.  Patient was last seen in primary care on 04/18/2024 by Anne Kenney NOVAK, FNP.  Called Nurse Triage reporting Sciatica.  Symptoms began yesterday.  Interventions attempted: Prescription medications: finished prednisone .  Symptoms are: gradually worsening.  Triage Disposition: See HCP Within 4 Hours (Or PCP Triage)pt is 2 hours away. Pt needs virtual appt. Called [facility] and spoke with Darice who stated to send request to see if Dr Marylynn can call in rx.   Patient/caregiver understands and will follow disposition?:          Copied from CRM [id number]. Topic: Clinical - Red Word Triage >> Apr 27, 2024  8:39 AM Anne Ingram wrote: Red Word that prompted transfer to Nurse Triage: Patient has sciatica- it is vey painful- she is very upset- she finished the medication and it is not doing any better- She lives 2 hours away and has been going to Dr Marylynn.  She got an x-ray last week and there has been no update on the imaging results. Reason for Disposition  [1] Pain radiates into the thigh or further down the leg AND [2] both legs  Answer Assessment - Initial Assessment Questions 1. ONSET: When did the pain begin? (e.g., minutes, hours, days)     yesterday 2. LOCATION: Where does it hurt? (upper, mid or lower back)     Left side lower back  3. SEVERITY: How bad is the pain?  (e.g., Scale 1-10; mild, moderate, or severe)     10  4. PATTERN: Is the pain constant? (e.g., yes, no; constant, intermittent)      constant 5. RADIATION: Does the pain shoot into your legs or somewhere else?     Down buttock down leg 6. CAUSE:  What do you think is causing the back pain?      sciatica  8. MEDICINES: What have you taken so far for the pain? (e.g., nothing, acetaminophen , NSAIDS)     Prednisone  and tramadol  10. OTHER SYMPTOMS: Do you have any other symptoms? (e.g., fever, abdomen pain, burning with  urination, blood in urine)       no  Protocols used: Back Pain-A-AH

## 2024-04-27 NOTE — Telephone Encounter (Signed)
 Xray resulted And padonda released results

## 2024-04-27 NOTE — Telephone Encounter (Signed)
 Pt stated that she is agreeable to the physical therapy and ortho referral but she lives in [place]. She stated that she will find the names of the places that she would like to have the referrals sent to. Pt would like to know if she can have a refill of the Tramadol . She stated that the pain was nmuch better while she was taking the prednisone  and the Tramadol .

## 2024-04-27 NOTE — Telephone Encounter (Signed)
 Spoke with reading room they are moving the xray up so it can be read today.

## 2024-04-30 ENCOUNTER — Encounter: Payer: Self-pay | Admitting: Internal Medicine

## 2024-05-18 ENCOUNTER — Ambulatory Visit: Payer: Self-pay

## 2024-05-18 NOTE — Telephone Encounter (Signed)
 FYI Only or Action Required?: Action required by provider: request for appointment.  Patient was last seen in primary care on 04/18/2024 by Anne Kenney NOVAK, FNP.  Called Nurse Triage reporting Anxiety.  Symptoms began several months ago.  Interventions attempted: Nothing.  Symptoms are: gradually worsening.  Triage Disposition: See PCP When Office is Open (Within 3 Days)  Patient/caregiver understands and will follow disposition?: No, wishes to speak with PCP      Copied from CRM #[id number]. Topic: Clinical - Red Word Triage >> May 18, 2024  2:50 PM [place] J wrote: [facility] that prompted transfer to Nurse Triage: Patient has been having anxiety and feeling depressed. Reason for Disposition  MODERATE anxiety (e.g., persistent or frequent anxiety symptoms; interferes with sleep, school, or work)    Economist offered next available appt with PCP, but spouse declined d/t currently being in [place] and needing appt today or tomorrow. Spouse requesting if appt can be accommodated today or tomorrow.  Triager will forward encounter for Dr Marylynn 's office to review and advise. Caregiver (Anne Ingram) verbalized understanding and is expecting call back from office for next steps. Please call Anne Ingram  Anne Ingram, Nodal (Spouse) [phone]   Triager also advised that if pt does not hear back from office, to follow disposition for further evaluation/treatment.  Answer Assessment - Initial Assessment Questions 1. CONCERN: Did anything happen that prompted you to call today?      Last evening had a bad state - with rapid heartbeat 2. ANXIETY SYMPTOMS: Can you describe how you (your loved one; patient) have been feeling? (e.g., tense, restless, panicky, anxious, keyed up, overwhelmed, sense of impending doom).      anxiety 3. ONSET: How long have you been feeling this way? (e.g., hours, days, weeks)     Progressively getting worse over the last few month 4. SEVERITY: How would you rate the level of  anxiety? (e.g., 0 - 10; or mild, moderate, severe).     Unable to answer, speaking to spouse 5. FUNCTIONAL IMPAIRMENT: How have these feelings affected your ability to do daily activities? Have you had more difficulty than usual doing your normal daily activities? (e.g., getting better, same, worse; self-care, school, work, interactions)     Spouse reports anxiety is affecting ADLs 6. HISTORY: Have you felt this way before? Have you ever been diagnosed with an anxiety problem in the past? (e.g., generalized anxiety disorder, panic attacks, PTSD). If Yes, ask: How was this problem treated? (e.g., medicines, counseling, etc.)     Denies any history 7. RISK OF HARM - SUICIDAL IDEATION: Do you ever have thoughts of hurting or killing yourself? If Yes, ask:  Do you have these feelings now? Do you have a plan on how you would do this?     denies 8. TREATMENT:  What has been done so far to treat this anxiety? (e.g., medicines, relaxation strategies). What has helped?     Nothing. Spouse reports that PCP instructed to keep a journal but spouse does not think she has been recording feelings. 9. THERAPIST: Do you have a counselor or therapist? If Yes, ask: What is their name?     N/a 10. POTENTIAL TRIGGERS: Do you drink caffeinated beverages (e.g., coffee, colas, teas), and how much daily? Do you drink alcohol or use any drugs? Have you started any new medicines recently?       Sciatic nerve 11. PATIENT SUPPORT: Who is with you now? Who do you live with? Do you have family or friends who  you can talk to?        Lives with husband and friend 44. OTHER SYMPTOMS: Do you have any other symptoms? (e.g., feeling depressed, trouble concentrating, trouble sleeping, trouble breathing, palpitations or fast heartbeat, chest pain, sweating, nausea, or diarrhea)       Sciatic nerve pain - spouse reports got INJ for tx 13. PREGNANCY: Is there any chance you are pregnant? When was  your last menstrual period?       N/a  Protocols used: Anxiety and Panic Attack-A-AH

## 2024-05-19 ENCOUNTER — Encounter: Payer: Self-pay | Admitting: Nurse Practitioner

## 2024-05-19 ENCOUNTER — Ambulatory Visit (INDEPENDENT_AMBULATORY_CARE_PROVIDER_SITE_OTHER): Admitting: Nurse Practitioner

## 2024-05-19 VITALS — BP 142/88 | HR 87 | Temp 98.2°F | Ht 62.0 in | Wt 158.4 lb

## 2024-05-19 DIAGNOSIS — F419 Anxiety disorder, unspecified: Secondary | ICD-10-CM | POA: Diagnosis not present

## 2024-05-19 DIAGNOSIS — R002 Palpitations: Secondary | ICD-10-CM | POA: Diagnosis not present

## 2024-05-19 MED ORDER — ESCITALOPRAM OXALATE 5 MG PO TABS: 5.0000 mg | ORAL_TABLET | Freq: Every day | ORAL | 1 refills | Status: DC

## 2024-05-19 NOTE — Patient Instructions (Signed)
Escitalopram Solution What is this medication? ESCITALOPRAM (es sye TAL oh pram) treats depression and anxiety. It increases the amount of serotonin in the brain, a hormone that helps regulate mood. It belongs to a group of medications called SSRIs. This medicine may be used for other purposes; ask your health care provider or pharmacist if you have questions. COMMON BRAND NAME(S): Lexapro What should I tell my care team before I take this medication? They need to know if you have any of these conditions: Bipolar disorder or a family history of bipolar disorder Diabetes Glaucoma Heart disease Kidney disease Liver disease Receiving electroconvulsive therapy Seizures Suicidal thoughts, plans, or attempt by you or a family member An unusual or allergic reaction to escitalopram, other medications, foods, dyes, or preservatives Pregnant or trying to become pregnant Breastfeeding How should I use this medication? Take this medication by mouth. Follow the directions on the prescription label. Use a specially marked spoon or container to measure your medication. Ask your pharmacist if you do not have one. Household spoons are not accurate. This medication can be taken with or without food. Take your medication at regular intervals. Do not take it more often than directed. Do not stop taking this medication suddenly except upon the advice of your care team. Stopping this medication too quickly may cause serious side effects or your condition may worsen. A special MedGuide will be given to you by the pharmacist with each prescription and refill. Be sure to read this information carefully each time. Talk to your care team about the use of this medication in children. Special care may be needed. Overdosage: If you think you have taken too much of this medicine contact a poison control center or emergency room at once. NOTE: This medicine is only for you. Do not share this medicine with others. What if I  miss a dose? If you miss a dose, take it as soon as you can. If it is almost time for your next dose, take only that dose. Do not take double or extra doses. What may interact with this medication? Do not take this medication with any of the following: Certain medications for fungal infections, such as fluconazole, itraconazole, ketoconazole, posaconazole, voriconazole Cisapride Citalopram Dronedarone Linezolid MAOIs, such as Carbex, Eldepryl, Marplan, Nardil, and Parnate Methylene blue (injected into a vein) Pimozide Thioridazine This medication may also interact with the following: Alcohol Amphetamines Aspirin and aspirin-like medications Carbamazepine Certain medications for mental health conditions Certain medications for migraine headache, such as almotriptan, eletriptan, frovatriptan, naratriptan, rizatriptan, sumatriptan, zolmitriptan Certain medications for sleep Certain medications that treat or prevent blood clots, such as warfarin, enoxaparin, and dalteparin Cimetidine Diuretics Dofetilide Fentanyl Furazolidone Isoniazid Lithium Metoprolol NSAIDs, medications for pain and inflammation, such as ibuprofen or naproxen Other medications that cause heart rhythm changes Procarbazine Rasagiline Supplements, such as St. John's wort, kava kava, valerian Tramadol Tryptophan Ziprasidone This list may not describe all possible interactions. Give your health care provider a list of all the medicines, herbs, non-prescription drugs, or dietary supplements you use. Also tell them if you smoke, drink alcohol, or use illegal drugs. Some items may interact with your medicine. What should I watch for while using this medication? Tell your care team if your symptoms do not get better or if they get worse. Visit your care team for regular checks on your progress. Because it may take several weeks to see the full effects of this medication, it is important to continue your treatment as  prescribed by  your care team. Watch for new or worsening thoughts of suicide or depression. This includes sudden changes in mood, behaviors, or thoughts. These changes can happen at any time but are more common in the beginning of treatment or after a change in dose. Call your care team right away if you experience these thoughts or worsening depression. This medication may cause mood and behavior changes, such as anxiety, nervousness, irritability, hostility, restlessness, excitability, hyperactivity, or trouble sleeping. These changes can happen at any time but are more common in the beginning of treatment or after a change in dose. Call your care team right away if you notice any of these symptoms. This medication may affect your coordination, reaction time, or judgment. Do not drive or operate machinery until you know how this medication affects you. Sit up or stand slowly to reduce the risk of dizzy or fainting spells. Drinking alcohol with this medication can increase the risk of these side effects. Your mouth may get dry. Chewing sugarless gum or sucking hard candy, and drinking plenty of water may help. Contact your care team if the problem does not go away or is severe. What side effects may I notice from receiving this medication? Side effects that you should report to your care team as soon as possible: Allergic reactions--skin rash, itching, hives, swelling of the face, lips, tongue, or throat Bleeding--bloody or black, tar-like stools, red or dark brown urine, vomiting blood or brown material that looks like coffee grounds, small, red or purple spots on skin, unusual bleeding or bruising Heart rhythm changes--fast or irregular heartbeat, dizziness, feeling faint or lightheaded, chest pain, trouble breathing Low sodium level--muscle weakness, fatigue, dizziness, headache, confusion Serotonin syndrome--irritability, confusion, fast or irregular heartbeat, muscle stiffness, twitching muscles,  sweating, high fever, seizure, chills, vomiting, diarrhea Sudden eye pain or change in vision such as blurry vision, seeing halos around lights, vision loss Thoughts of suicide or self-harm, worsening mood, feelings of depression Side effects that usually do not require medical attention (report to your care team if they continue or are bothersome): Change in sex drive or performance Diarrhea Excessive sweating Nausea Tremors or shaking Upset stomach This list may not describe all possible side effects. Call your doctor for medical advice about side effects. You may report side effects to FDA at 1-800-FDA-1088. Where should I keep my medication? Keep out of reach of children and pets. Store at room temperature between 15 and 30 degrees C (59 and 86 degrees F). Throw away any unused medication after the expiration date. NOTE: This sheet is a summary. It may not cover all possible information. If you have questions about this medicine, talk to your doctor, pharmacist, or health care provider.  2024 Elsevier/Gold Standard (2022-05-18 00:00:00)

## 2024-05-19 NOTE — Progress Notes (Signed)
 Established Patient Office Visit  Subjective:  Patient ID: Anne Ingram, female    DOB: 09-17-1955  Age: 68 y.o. MRN: [id number]  CC:  Chief Complaint  Patient presents with   Acute Visit    Anxiety getting worse due to bulging disc with pain,2 tachycardia events within 2 days (had an ablation years ago)   Discussed the use of a AI scribe software for clinical note transcription with the patient, who gave verbal consent to proceed.  HPI  Anne Ingram is a 68 year old female accompanied by her spouse with episodes anxiety and palpation.  She experiences episodes of rapid heartbeat and anxiety, worsening over the past 48 hours. An ablation over a year ago initially resolved her symptoms, but she recently had two episodes, one on Wednesday night and another yesterday. These episodes are associated with anxiety and an inability to function, requiring her to lie down and rest. She took an additional dose of metoprolol  during these episodes and eventually fell asleep.  She has received steroid injections last week. She has been dealing with pain due to a bulging disc for almost two months, starting in early August. She manages the pain with Tylenol  and methocarbamol, and occasionally takes Advil. The pain impacts her sleep, causing her to wake up around 3:00 AM and walk around to alleviate discomfort.  Patient has been experiencing anxiety from last December but the symptoms were under control but has been recently worsened due to pain. Patient used to live in [place] and moved to [place]  during COVID.  She she had close set of friends in [place] area and feels like she is following in [place] .  Her anxiety and depression have been worsening, particularly in the context of her current pain management issues. She has not previously taken medication for anxiety but is considering it due to the recent exacerbation of symptoms. She feels anxious and has intrusive thoughts contributing to  her stress.  The patient would like to get started on anxiety medication.   She denies any chest pain, shortness of breath, swelling in the legs or palpitation at present.   Past Medical History:  Diagnosis Date   Allergy Baby   Alkergic to penicillin   Cardiac arrhythmia due to congenital heart disease    Chicken pox    Elevated blood pressure reading    Hypertension 03/2020    Past Surgical History:  Procedure Laterality Date   CESAREAN SECTION  [phone]   SVT ABLATION N/A 12/21/2022   Procedure: SVT ABLATION;  Surgeon: Cindie Ole DASEN, MD;  Location: [facility] INVASIVE CV LAB;  Service: Cardiovascular;  Laterality: N/A;   TUBAL LIGATION  1995    Family History  Problem Relation Age of Onset   Hypertension Mother    Mental illness Mother    Cancer Neg Hx    Breast cancer Neg Hx     Social History   Socioeconomic History   Marital status: Married    Spouse name: Not on file   Number of children: Not on file   Years of education: Not on file   Highest education level: Not on file  Occupational History   Not on file  Tobacco Use   Smoking status: Former   Smokeless tobacco: Never  Substance and Sexual Activity   Alcohol use: Yes    Alcohol/week: 5.0 standard drinks of alcohol    Types: 5 Standard drinks or equivalent per week    Comment: rarely   Drug use: No  Sexual activity: Not on file  Other Topics Concern   Not on file  Social History Narrative   Not on file   Social Drivers of Health   Financial Resource Strain: Low Risk  (08/31/2023)   Received from [facility]   Overall Financial Resource Strain (CARDIA)    Difficulty of Paying Living Expenses: Not hard at all  Food Insecurity: No Food Insecurity (08/31/2023)   Received from [facility]   Hunger Vital Sign    Within the past 12 months, you worried that your food would run out before you got the money to buy more.: Never true    Within the past 12 months, the  food you bought just didn't last and you didn't have money to get more.: Never true  Transportation Needs: No Transportation Needs (08/31/2023)   Received from [facility]    In the past 12 months, has lack of transportation kept you from medical appointments or from getting medications?: No    Lack of Transportation (Non-Medical): No  Physical Activity: Not on file  Stress: Not on file  Social Connections: Not on file  Intimate Partner Violence: Not on file     Outpatient Medications Prior to Visit  Medication Sig Dispense Refill   calcipotriene  (DOVONOX) 0.005 % cream Apply topically 2 (two) times daily. To  psoriatic rash on scalp 60 g 2   Calcium Carbonate-Vit D-Min (CALCIUM 600+D PLUS MINERALS PO) Take 1 tablet by mouth 2 (two) times a week.     estradiol  (ESTRACE ) 0.1 MG/GM vaginal cream INSERT ONE APPLICATORFUL VAGINALLY 2 TIMES A WEEK 127.5 g 0   ibuprofen (ADVIL) 200 MG tablet Take 200-400 mg by mouth every 6 (six) hours as needed for moderate pain. (Patient taking differently: Take 200-400 mg by mouth as needed for moderate pain (pain score 4-6).)     metoprolol  succinate (TOPROL -XL) 50 MG 24 hr tablet TAKE 1 TABLET BY MOUTH DAILY WITH A MEAL OR IMMEDIATELY FOLLOWING A MEAL 90 tablet 2   metoprolol  tartrate (LOPRESSOR ) 25 MG tablet TAKE A HALF TABLET BY MOUTH 2 TIMES A DAY AS NEEDED 90 tablet 3   methocarbamol (ROBAXIN) 750 MG tablet TAKE 1 TABLET 3 TIMES A DAY BY ORAL ROUTE.     methylPREDNISolone  (MEDROL  DOSEPAK) 4 MG TBPK tablet As directed (Patient not taking: Reported on 05/19/2024) 21 tablet 0   No facility-administered medications prior to visit.    Allergies  Allergen Reactions   Latex Rash   Penicillins     Unknown childhood reaction     ROS Review of Systems Negative unless indicated in HPI.    Objective:    Physical Exam Constitutional:      Appearance: Normal appearance.  HENT:     Mouth/Throat:     Mouth:  Mucous membranes are moist.  Eyes:     Conjunctiva/sclera: Conjunctivae normal.     Pupils: Pupils are equal, round, and reactive to light.  Cardiovascular:     Rate and Rhythm: Normal rate and regular rhythm.     Pulses: Normal pulses.     Heart sounds: Normal heart sounds.  Pulmonary:     Effort: Pulmonary effort is normal.     Breath sounds: Normal breath sounds.  Abdominal:     General: Bowel sounds are normal.     Palpations: Abdomen is soft.  Musculoskeletal:     Cervical back: Normal range of motion. No tenderness.  Skin:  General: Skin is warm.     Findings: No bruising.  Neurological:     General: No focal deficit present.     Mental Status: She is alert and oriented to person, place, and time. Mental status is at baseline.  Psychiatric:        Mood and Affect: Mood normal.        Behavior: Behavior normal.        Thought Content: Thought content normal.        Judgment: Judgment normal.     BP (!) 142/88   Pulse 87   Temp 98.2 F (36.8 C)   Ht 5' 2 (1.575 m)   Wt 158 lb 6.4 oz (71.8 kg)   SpO2 96%   BMI 28.97 kg/m  Wt Readings from Last 3 Encounters:  05/26/24 157 lb 6.4 oz (71.4 kg)  05/19/24 158 lb 6.4 oz (71.8 kg)  04/18/24 162 lb (73.5 kg)     Health Maintenance  Topic Date Due   Pneumococcal Vaccine: 50+ Years (1 of 1 - PCV) Never done   COVID-19 Vaccine (3 - Moderna risk series) 01/01/2020   Cervical Cancer Screening (Pap smear)  08/11/2023   Medicare Annual Wellness (AWV)  08/11/2023   Mammogram  06/28/2024   Influenza Vaccine  11/21/2024 (Originally 03/24/2024)   DTaP/Tdap/Td (3 - Td or Tdap) 07/27/2033   Colonoscopy  10/26/2033   DEXA SCAN  Completed   Hepatitis C Screening  Completed   Zoster Vaccines- Shingrix  Completed   Meningococcal B Vaccine  Aged Out    There are no preventive care reminders to display for this patient.  Lab Results  Component Value Date   TSH 1.37 08/11/2023   Lab Results  Component Value Date   WBC  7.3 08/11/2023   HGB 13.9 08/11/2023   HCT 42.2 08/11/2023   MCV 90.0 08/11/2023   PLT 289.0 08/11/2023   Lab Results  Component Value Date   NA 139 08/11/2023   K 4.4 08/11/2023   CO2 27 08/11/2023   GLUCOSE 97 08/11/2023   BUN 14 08/11/2023   CREATININE 0.74 08/11/2023   BILITOT 0.4 08/11/2023   ALKPHOS 65 08/11/2023   AST 16 08/11/2023   ALT 15 08/11/2023   PROT 7.1 08/11/2023   ALBUMIN 4.5 08/11/2023   CALCIUM 9.7 08/11/2023   ANIONGAP 7 12/10/2022   GFR 84.00 08/11/2023   Lab Results  Component Value Date   CHOL 209 (H) 08/11/2023   Lab Results  Component Value Date   HDL 58.70 08/11/2023   Lab Results  Component Value Date   LDLCALC 132 (H) 08/11/2023   Lab Results  Component Value Date   TRIG 88.0 08/11/2023   Lab Results  Component Value Date   CHOLHDL 4 08/11/2023   Lab Results  Component Value Date   HGBA1C 6.2 08/11/2023      Assessment & Plan:  Anxiety Assessment & Plan: PHQ-9 score 2 and GAD 7 score 3.No prior medication. Open to Lexapro . - Prescribed Lexapro . Monitor for side effects and effectiveness. - Provided information on Lexapro , including potential side effects. - Advised patient to perform deep breathing exercises, journaling and meditation. - Encouraged follow-up in one month to assess effectiveness.   Palpitation Assessment & Plan: Two episodes of rapid heartbeat in last 48 hours, resolved with metoprolol .  Patient has history of SVT.  Patient has steroid injection last week due to back pain. - Advised patient to follow-up with cardiology.   Other orders -  Escitalopram  Oxalate; Take 1 tablet (5 mg total) by mouth daily.  Dispense: 30 tablet; Refill: 1    Follow-up: Return in about 1 month (around 06/18/2024), or PCP, for chronic management.   Kashus Karlen, NP

## 2024-05-22 ENCOUNTER — Telehealth: Payer: Self-pay | Admitting: Cardiology

## 2024-05-22 NOTE — Telephone Encounter (Signed)
 Patient called in to schedule an appointment due to having breakthrough af. She states this started 3-4 weeks ago, just started out intermittently but now is debilitating. She states she feels faint if she doesn't lay down. She will apply a cold compress to get it to stop. She states it can last an hour, but like the other day, it can last a couple of hours.   Patient is scheduled for 10/4 w/ Daril Kicks, PA due to no availability with Dr. Griselda APP.

## 2024-05-26 ENCOUNTER — Ambulatory Visit (HOSPITAL_COMMUNITY): Admitting: Physician Assistant

## 2024-05-26 ENCOUNTER — Inpatient Hospital Stay (HOSPITAL_COMMUNITY)
Admission: RE | Admit: 2024-05-26 | Discharge: 2024-05-26 | Disposition: A | Discharge status: Home / Self Care | Source: Ambulatory Visit | Attending: Physician Assistant | Admitting: Physician Assistant

## 2024-05-26 ENCOUNTER — Ambulatory Visit (HOSPITAL_COMMUNITY)
Admission: RE | Admit: 2024-05-26 | Discharge: 2024-05-26 | Disposition: A | Discharge status: Home / Self Care | Source: Ambulatory Visit | Attending: Physician Assistant | Admitting: Physician Assistant

## 2024-05-26 VITALS — BP 152/76 | HR 60 | Ht 62.0 in | Wt 157.4 lb

## 2024-05-26 DIAGNOSIS — I471 Supraventricular tachycardia, unspecified: Secondary | ICD-10-CM | POA: Diagnosis present

## 2024-05-26 DIAGNOSIS — R002 Palpitations: Secondary | ICD-10-CM

## 2024-05-26 DIAGNOSIS — I4891 Unspecified atrial fibrillation: Secondary | ICD-10-CM | POA: Diagnosis present

## 2024-05-26 NOTE — Progress Notes (Addendum)
 Primary Care Physician: Marylynn Verneita CROME, MD Primary Cardiologist: None Electrophysiologist: OLE ONEIDA HOLTS, MD  Referring Physician: Dr HOLTS Orie LABOR Gwinner is a 68 y.o. female with a history of SVT, HTN, anxiety who presents for follow up in the [facility].  The patient underwent ablation for AVNRT 12/21/22 with Dr HOLTS.    Patient presents today for follow up for palpitations. She noted palpitations at her visit with Suzann Riddle in February but since they were rare and not very symptomatic further workup was deferred. Patient called the clinic 05/22/24 with increased episodes of tachypalpitations associated with presyncope and weakness. She is in SR today. She did have a steroid injection 9/16 for sciatica.   Today, she denies symptoms of chest pain, shortness of breath, orthopnea, PND, lower extremity edema, dizziness, presyncope, syncope, snoring, daytime somnolence, bleeding, or neurologic sequela. The patient is tolerating medications without difficulties and is otherwise without complaint today.    ROS- All systems are reviewed and negative except as per the HPI above.  Past Medical History:  Diagnosis Date   Allergy Baby   Alkergic to penicillin   Cardiac arrhythmia due to congenital heart disease    Chicken pox    Elevated blood pressure reading    Hypertension 03/2020    Current Outpatient Medications  Medication Sig Dispense Refill   acetaminophen  (TYLENOL ) 500 MG tablet Take 1,000 mg by mouth every 6 (six) hours.     calcipotriene  (DOVONOX) 0.005 % cream Apply topically 2 (two) times daily. To  psoriatic rash on scalp 60 g 2   Calcium Carbonate-Vit D-Min (CALCIUM 600+D PLUS MINERALS PO) Take 1 tablet by mouth 2 (two) times a week.     escitalopram  (LEXAPRO ) 5 MG tablet Take 1 tablet (5 mg total) by mouth daily. 30 tablet 1   estradiol  (ESTRACE ) 0.1 MG/GM vaginal cream INSERT ONE APPLICATORFUL VAGINALLY 2 TIMES A WEEK 127.5 g 0    ibuprofen (ADVIL) 200 MG tablet Take 200-400 mg by mouth every 6 (six) hours as needed for moderate pain. (Patient taking differently: Take 200-400 mg by mouth as needed for moderate pain (pain score 4-6).)     metoprolol  succinate (TOPROL -XL) 50 MG 24 hr tablet TAKE 1 TABLET BY MOUTH DAILY WITH A MEAL OR IMMEDIATELY FOLLOWING A MEAL 90 tablet 2   metoprolol  tartrate (LOPRESSOR ) 25 MG tablet TAKE A HALF TABLET BY MOUTH 2 TIMES A DAY AS NEEDED 90 tablet 3   No current facility-administered medications for this encounter.    Physical Exam: BP (!) 152/76   Pulse 60   Ht 5' 2 (1.575 m)   Wt 71.4 kg   BMI 28.79 kg/m   GEN: Well nourished, well developed in no acute distress CARDIAC: Regular rate and rhythm, no murmurs, rubs, gallops RESPIRATORY:  Clear to auscultation without rales, wheezing or rhonchi  ABDOMEN: Soft, non-tender, non-distended EXTREMITIES:  No edema; No deformity   Wt Readings from Last 3 Encounters:  05/26/24 71.4 kg  05/19/24 71.8 kg  04/18/24 73.5 kg     EKG today demonstrates  SR Vent. rate 60 BPM PR interval 156 ms QRS duration 80 ms QT/QTcB 416/416 ms   Echo 12/14/22 demonstrated   1. Left ventricular ejection fraction, by estimation, is 55 to 60%. The  left ventricle has normal function. The left ventricle has no regional  wall motion abnormalities. Left ventricular diastolic parameters were  normal.   2. Right ventricular systolic function is normal. The  right ventricular  size is normal.   3. The mitral valve is normal in structure. Trivial mitral valve  regurgitation. No evidence of mitral stenosis.   4. The aortic valve is normal in structure. Aortic valve regurgitation is  not visualized. No aortic stenosis is present.   5. The inferior vena cava is normal in size with greater than 50%  respiratory variability, suggesting right atrial pressure of 3 mmHg.    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 Will have her wear a cardiac monitor to evaluate arrhythmia. If she is having more SVT, can consider starting flecainide. Continue Toprol  50 mg daily with Lopressor  25 mg BID PRN for heart racing. No room to increase daily dose with baseline HR 60 bpm.  She does not have a diagnosis of atrial fibrillation.   HTN Mildly elevate today, controlled at previous visits. Continue to monitor, no change today.    Follow up with Suzann Riddle NP after monitor results. [place] on [place].    [facility] [address] [place], [place] [address] [phone]

## 2024-06-01 DIAGNOSIS — F419 Anxiety disorder, unspecified: Secondary | ICD-10-CM | POA: Insufficient documentation

## 2024-06-01 NOTE — Assessment & Plan Note (Addendum)
 Two episodes of rapid heartbeat in last 48 hours, resolved with metoprolol .  Patient has history of SVT.  Patient has steroid injection last week due to back pain. - Advised patient to follow-up with cardiology.

## 2024-06-01 NOTE — Assessment & Plan Note (Addendum)
 PHQ-9 score 2 and GAD 7 score 3.No prior medication. Open to Lexapro . - Prescribed Lexapro . Monitor for side effects and effectiveness. - Provided information on Lexapro , including potential side effects. - Advised patient to perform deep breathing exercises, journaling and meditation. - Encouraged follow-up in one month to assess effectiveness.

## 2024-06-09 ENCOUNTER — Ambulatory Visit: Attending: Cardiology | Admitting: Cardiology

## 2024-06-09 ENCOUNTER — Ambulatory Visit

## 2024-06-09 ENCOUNTER — Encounter: Payer: Self-pay | Admitting: Cardiology

## 2024-06-09 VITALS — BP 120/72 | HR 76 | Wt 157.0 lb

## 2024-06-09 DIAGNOSIS — Z79899 Other long term (current) drug therapy: Secondary | ICD-10-CM | POA: Insufficient documentation

## 2024-06-09 DIAGNOSIS — R002 Palpitations: Secondary | ICD-10-CM

## 2024-06-09 DIAGNOSIS — I471 Supraventricular tachycardia, unspecified: Secondary | ICD-10-CM

## 2024-06-09 NOTE — Progress Notes (Signed)
 Electrophysiology Clinic Note    Date:  06/09/2024  Patient ID:  Anne Ingram, DOB 1956-01-16, MRN [id number] PCP:  Marylynn Verneita CROME, MD  Cardiologist:  None  Electrophysiologist:  OLE ONEIDA HOLTS, MD  Electrophysiology APP:  Toris Laverdiere, NP     Discussed the use of AI scribe software for clinical note transcription with the patient, who gave verbal consent to proceed.   Patient Profile    Chief Complaint: tachypalpitations  History of Present Illness: Anne Ingram is a 68 y.o. female with PMH notable for SVT s/p AVNRT ablation; seen today for OLE ONEIDA HOLTS, MD for routine electrophysiology followup.   I last saw her 09/2023 for routine follow-up and she was having infrequent palpitations much less severet han her prior AVNRT. Did not favor zio given rarity of episodes.   She had steroid spinal injection at the end of September.  She saw AF clinic 10/3 for acute visit d/t significantly increased burden of tachypalpitations. Zio monitor ordered at that time.   Today, she tells me that when she wore the zio monitor, she did not have any palpitation episodes, but had another very symptomatic one earlier this week on Tuesday. She layed down to see if episode would stop on it's on, and after abotu 45 minutes she took her PRN metoprolol  and within about 10 minutes felt relief. She glanced at her watch during the episode and saw her HR was > 200. Her watch only shows pulse, not rhythm.  During episodes, she does have presyncope and has fatigue afterwards.   She has reduced caffeine intake, no ETOH.   Currently, she denies chest pain, chest pressure, palpitations, presyncope.   Her husband joins for appt.      Arrhythmia/Device History No specialty comments available.    ROS:  Please see the history of present illness. All other systems are reviewed and otherwise negative.    Physical Exam    VS:  BP 120/72 (BP Location: Left Arm, Patient Position: Sitting,  Cuff Size: Normal)   Pulse 76   Wt 157 lb (71.2 kg)   SpO2 98%   BMI 28.72 kg/m  BMI: Body mass index is 28.72 kg/m.           Wt Readings from Last 3 Encounters:  06/09/24 157 lb (71.2 kg)  05/26/24 157 lb 6.4 oz (71.4 kg)  05/19/24 158 lb 6.4 oz (71.8 kg)     GEN- The patient is well appearing, alert and oriented x 3 today.   Lungs- Clear to ausculation bilaterally, normal work of breathing.  Heart- Regular rate and rhythm, no murmurs, rubs or gallops Extremities- No peripheral edema, warm, dry    Studies Reviewed   Previous EP, cardiology notes.    EKG is not ordered. Personal review of EKG from 05/26/2024 shows:  SR at 60        Long term monitor, 06/08/2024 Rare supraventricular and ventricular ectopy. No sustained arrhythmias. No atrial fibrillation.   Assessment and Plan     #) SVT #) palpitations S/p AVNRT ablation 11/2022 Has continued to have period palpitation episodes that are brief in duration and do not effect her day-to-day activities She had spinal steroid injection end of September, and since that time has had increase in tachy palpitations recently, recent 4 day Zio without significant arrhythmia I would anticipate per palpitation episodes to return to her usual pre-injection levels, but this does not seem to be the case We discussed ILR versus  repeating zio for longer duration. She prefers to start with zio to attempt to capture arrhythmia. If unable to capture, she is agreeable to ILR implant We discussed risks/benefits of ILR Update BMP, Mag today        Current medicines are reviewed at length with the patient today.   The patient does not have concerns regarding her medicines.  The following changes were made today:  none  Labs/ tests ordered today include:  Orders Placed This Encounter  Procedures   Basic metabolic panel with GFR   Magnesium   LONG TERM MONITOR (3-14 DAYS)     Disposition: Follow up with Dr. Cindie or EP APP  7-8 weeks    Signed, Chantal Needle, NP  06/09/24  3:28 PM  Electrophysiology [facility]

## 2024-06-09 NOTE — Patient Instructions (Signed)
 Medication Instructions:  Your physician recommends that you continue on your current medications as directed. Please refer to the Current Medication list given to you today.  *If you need a refill on your cardiac medications before your next appointment, please call your pharmacy*  Lab Work: Your provider would like for you to have following labs drawn today BMET, MAG.   If you have labs (blood work) drawn today and your tests are completely normal, you will receive your results only by: MyChart Message (if you have MyChart) OR A paper copy in the mail If you have any lab test that is abnormal or we need to change your treatment, we will call you to review the results.  Testing/Procedures:  Anne Ingram- Long Term Monitor Instructions  Your physician has requested you wear a ZIO patch monitor for 14 days.  This is a single patch monitor. [employer] supplies one patch monitor per enrollment. Additional stickers are not available. Please do not apply patch if you will be having a Nuclear Stress Test, Echocardiogram, Cardiac CT, MRI, or Chest Xray during the period you would be wearing the monitor. The patch cannot be worn during these tests. You cannot remove and re-apply the ZIO XT patch monitor.  Your ZIO patch monitor will be mailed 3 day USPS to your address on file. It may take 3-5 days to receive your monitor after you have been enrolled. Once you have received your monitor, please review the enclosed instructions. Your monitor has already been registered assigning a specific monitor serial number to you.  Billing and Patient Assistance Program Information  We have supplied [employer] with any of your insurance information on file for billing purposes.  [employer] offers a sliding scale Patient Assistance Program for patients that do not have insurance, or whose insurance does not completely cover the cost of the ZIO monitor.  You must apply for the Patient Assistance Program to qualify for this discounted  rate.  To apply, please call [employer] at [phone], select option 4, select option 2, ask to apply for Patient Assistance Program. Meredeth will ask your household income, and how many people are in your household. They will quote your out-of-pocket cost based on that information. [employer] will also be able to set up a 77-month, interest-free payment plan if needed.  Applying the monitor   Shave hair from upper left chest.  Hold abrader disc by orange tab. Rub abrader in 40 strokes over the upper left chest as indicated in your monitor instructions.  Clean area with 4 enclosed alcohol pads. Let dry.  Apply patch as indicated in monitor instructions. Patch will be placed under collarbone on left side of chest with arrow pointing upward.  Rub patch adhesive wings for 2 minutes. Remove white label marked 1. Remove the white label marked 2. Rub patch adhesive wings for 2 additional minutes.  While looking in a mirror, press and release button in center of patch. A small green light will flash 3-4 times. This will be your only indicator that the monitor has been turned on.   After Applying Monitor: Do not shower for the first 24 hours. You may shower after the first 24 hours.  Press the button if you feel a symptom. You will hear a small click. Record Date, Time and Symptom in the Patient Logbook.   After Completing 14 Days: When you are ready to remove the patch, follow instructions on the last 2 pages of Patient Logbook.  Stick patch monitor into the tabs  at the bottom of the return box.  Place Patient Logbook in the blue and white box. Use locking tab on box and tape box closed securely. The blue and white box has prepaid postage on it. Please place it in the mailbox as soon as possible. Your physician should have your test results approximately 7-14 days after the monitor has been mailed back to [facility]   Troubleshooting: Call [facility] at [phone] if you have  questions regarding your ZIO XT patch monitor.  Call them immediately if you see an orange light blinking on your monitor.  If your monitor falls off in less than 4 days, contact our Monitor department at [phone].  If your monitor becomes loose or falls off after 4 days call [employer] at [phone] for suggestions on securing your monitor.   Follow-Up: At [facility], you and your health needs are our priority.  As part of our continuing mission to provide you with exceptional heart care, our providers are all part of one team.  This team includes your primary Cardiologist (physician) and Advanced Practice Providers or APPs (Physician Assistants and Nurse Practitioners) who all work together to provide you with the care you need, when you need it.  Your next appointment:   7  week(s)  Provider:   Suzann Riddle, NP    We recommend signing up for the patient portal called MyChart.  Sign up information is provided on this After Visit Summary.  MyChart is used to connect with patients for Virtual Visits (Telemedicine).  Patients are able to view lab/test results, encounter notes, upcoming appointments, etc.  Non-urgent messages can be sent to your provider as well.   To learn more about what you can do with MyChart, go to [email or web address].

## 2024-06-10 LAB — BASIC METABOLIC PANEL WITH GFR
BUN/Creatinine Ratio: 11 — ABNORMAL LOW (ref 12–28)
BUN: 14 mg/dL (ref 8–27)
CO2: 23 mmol/L (ref 20–29)
Calcium: 9.4 mg/dL (ref 8.7–10.3)
Chloride: 104 mmol/L (ref 96–106)
Creatinine, Ser: 1.24 mg/dL — ABNORMAL HIGH (ref 0.57–1.00)
Glucose: 102 mg/dL — ABNORMAL HIGH (ref 70–99)
Potassium: 4.4 mmol/L (ref 3.5–5.2)
Sodium: 141 mmol/L (ref 134–144)
eGFR: 48 mL/min/1.73 — ABNORMAL LOW (ref >59)

## 2024-06-10 LAB — MAGNESIUM: Magnesium: 2 mg/dL (ref 1.6–2.3)

## 2024-06-11 ENCOUNTER — Ambulatory Visit: Payer: Self-pay | Admitting: Cardiology

## 2024-06-13 ENCOUNTER — Other Ambulatory Visit: Payer: Self-pay | Admitting: Emergency Medicine

## 2024-06-13 DIAGNOSIS — Z79899 Other long term (current) drug therapy: Secondary | ICD-10-CM

## 2024-06-13 NOTE — Progress Notes (Signed)
 Called and spoke with the patient.  Patient is currently on vacation in [place].  Patient informed of the lab results as interpreted by Suzann Riddle, NP with the recommendation of staying well hydrated and the need to repeat blood work in about a week.  Order for BMP placed.  Patient verbalized understanding and agreement with treatment plan.  All questions and concerns addressed at this time.

## 2024-06-14 ENCOUNTER — Telehealth: Payer: Self-pay | Admitting: Emergency Medicine

## 2024-06-14 NOTE — Telephone Encounter (Signed)
 Patient called and was transferred to this RN.  Spoke with the patient and her spouse regarding the phone call made yesterday.  The spouse wanted clarification on the instructions given to the patient.  Reiterated the same instructions regarding staying well hydrated and need for a repeat lab work once they come back from [place].  Patient and spouse verbalized understanding and agreement with the treatment plan with all questions and concerns addressed at this time.

## 2024-06-14 NOTE — Telephone Encounter (Signed)
 Husband/patient returned RN's call regarding results.

## 2024-06-22 ENCOUNTER — Ambulatory Visit: Admitting: Internal Medicine

## 2024-06-23 ENCOUNTER — Ambulatory Visit: Admitting: Internal Medicine

## 2024-06-30 ENCOUNTER — Other Ambulatory Visit: Payer: Self-pay | Admitting: Emergency Medicine

## 2024-06-30 DIAGNOSIS — Z79899 Other long term (current) drug therapy: Secondary | ICD-10-CM

## 2024-07-01 LAB — BASIC METABOLIC PANEL WITH GFR
BUN/Creatinine Ratio: 15 (ref 12–28)
BUN: 12 mg/dL (ref 8–27)
CO2: 23 mmol/L (ref 20–29)
Calcium: 9.6 mg/dL (ref 8.7–10.3)
Chloride: 105 mmol/L (ref 96–106)
Creatinine, Ser: 0.78 mg/dL (ref 0.57–1.00)
Glucose: 91 mg/dL (ref 70–99)
Potassium: 4.7 mmol/L (ref 3.5–5.2)
Sodium: 142 mmol/L (ref 134–144)
eGFR: 83 mL/min/1.73 (ref >59)

## 2024-07-02 ENCOUNTER — Ambulatory Visit: Payer: Self-pay | Admitting: Cardiology

## 2024-07-12 DIAGNOSIS — R002 Palpitations: Secondary | ICD-10-CM

## 2024-07-12 DIAGNOSIS — I471 Supraventricular tachycardia, unspecified: Secondary | ICD-10-CM

## 2024-07-13 MED ORDER — APIXABAN 5 MG PO TABS: 5.0000 mg | ORAL_TABLET | Freq: Two times a day (BID) | ORAL | 3 refills | Status: AC

## 2024-07-14 ENCOUNTER — Telehealth: Payer: Self-pay | Admitting: Cardiology

## 2024-07-14 ENCOUNTER — Telehealth: Payer: Self-pay | Admitting: Pharmacy Technician

## 2024-07-14 ENCOUNTER — Other Ambulatory Visit (HOSPITAL_COMMUNITY): Payer: Self-pay

## 2024-07-14 NOTE — Telephone Encounter (Signed)
 Pt c/o medication issue:  1. Name of Medication:  apixaban  (ELIQUIS ) 5 MG TABS tablet  2. How are you currently taking this medication (dosage and times per day)?   3. Are you having a reaction (difficulty breathing--STAT)?   4. What is your medication issue?  Patient says the medication is very expensive and the pharmacist at [facility] says there are coupons available, and prescriber may be able to provide them. Patient would like to know how to go about getting coupons. She mentions that she lives 2 hours away. Please advise.

## 2024-07-14 NOTE — Telephone Encounter (Signed)
Please see updated phone encounter

## 2024-07-14 NOTE — Telephone Encounter (Signed)
  Patient's husband calling back to follow up. He is requesting if he gets the call back, he said he can explain it better about the patient's eliquis . He is also requesting if they can receive a call today since they will go out of town on monday

## 2024-07-14 NOTE — Telephone Encounter (Signed)
 Eliquis    She is not eligible for a grant since she does not appear to have cardiomyopathy, I got her a coupon to see if her insurance will allow it since the insurance didn't flat out say medicare. The pharmacy is getting a rejection saying she is medicare and coupon is not allowed. She doesn't take enough meds to qualify for assistance with bms.  [place] gave me a free 30 day coupon for eliquis  at the request of the patient to get them until they see the doctor to go over their options  bin [phone], pcn [phone], grp [id number], ID [id number] 30 day info [facility] [phone] Per test claims: Xarelto would be 86.05 for 30 days  Dabigatran would be 29.83 for 30 days      Patient aware of all the things over the phone and sent mychart to confirm  Xarelto would be 86.05 for 30 days  Dabigatran would be 29.83 for 30 days

## 2024-07-15 ENCOUNTER — Other Ambulatory Visit: Payer: Self-pay | Admitting: Nurse Practitioner

## 2024-07-25 NOTE — Progress Notes (Unsigned)
 Electrophysiology Clinic Note    Date:  07/26/2024  Patient ID:  Anne Ingram, DOB 1956-04-12, MRN [id number] PCP:  Marylynn Verneita CROME, MD  Cardiologist:  None  Electrophysiologist:  OLE ONEIDA HOLTS, MD  Electrophysiology APP:  Leira Regino, NP     Discussed the use of AI scribe software for clinical note transcription with the patient, who gave verbal consent to proceed.   Patient Profile    Chief Complaint: tachypalpitations  History of Present Illness: Anne Ingram is a 68 y.o. female with PMH notable for SVT s/p AVNRT ablation; seen today for OLE ONEIDA HOLTS, MD for routine electrophysiology followup.   She is s/p EPS w 11/2022 with induction of AVNRT where the slow pathway was successfully ablated.  I last saw her 05/2024 where she had increased palpitation episodes, initially thought due to a steroid spinal injection but that seemed to persist. Recommended additional Zio monitor to further evaluate.  Zio monitor revealed 11% atrial fibrillation burden and she was started on Eliquis .  On follow-up today, she had no cardiac awareness of her AFib episodes while wearing the most recent zio monitor. She does note that she had the flu while wearing monitor.  She is tolerating eliquis  well without bleeding concerns. She has not refilled the rx yet, so unsure if it is unaffordable.     Her husband joins for appt.      Arrhythmia/Device History No specialty comments available.    ROS:  Please see the history of present illness. All other systems are reviewed and otherwise negative.    Physical Exam    VS:  BP 116/68 (BP Location: Left Arm, Patient Position: Sitting, Cuff Size: Normal)   Pulse 65   Ht 5' 2 (1.575 m)   Wt 157 lb (71.2 kg)   SpO2 99%   BMI 28.72 kg/m  BMI: Body mass index is 28.72 kg/m.           Wt Readings from Last 3 Encounters:  07/26/24 157 lb (71.2 kg)  06/09/24 157 lb (71.2 kg)  05/26/24 157 lb 6.4 oz (71.4 kg)     GEN- The  patient is well appearing, alert and oriented x 3 today.   Lungs- Clear to ausculation bilaterally, normal work of breathing.  Heart- Regular rate and rhythm, no murmurs, rubs or gallops Extremities- No peripheral edema, warm, dry    Studies Reviewed   Previous EP, cardiology notes.    EKG is ordered. Personal review of EKG from today shows:   EKG Interpretation Date/Time:  Wednesday July 26 2024 10:09:56 EST Ventricular Rate:  65 PR Interval:  160 QRS Duration:  80 QT Interval:  424 QTC Calculation: 440 R Axis:   -5  Text Interpretation: Normal sinus rhythm Confirmed by Darien Kading [phone]) on 07/26/2024 10:25:29 AM    Long term monitor, 06/09/2024 HR 42 to 226, average 73. 11% burden of atrial fibrillation, average rate 130 bpm, longest 32 seconds. Rare supraventricular and ventricular ectopy.  Long term monitor, 06/08/2024 Rare supraventricular and ventricular ectopy. No sustained arrhythmias. No atrial fibrillation.  TTE, 12/14/2022  1. Left ventricular ejection fraction, by estimation, is 55 to 60%. The left ventricle has normal function. The left ventricle has no regional wall motion abnormalities. Left ventricular diastolic parameters were normal.   2. Right ventricular systolic function is normal. The right ventricular size is normal.   3. The mitral valve is normal in structure. Trivial mitral valve regurgitation. No evidence of mitral  stenosis.   4. The aortic valve is normal in structure. Aortic valve regurgitation is not visualized. No aortic stenosis is present.   5. The inferior vena cava is normal in size with greater than 50% respiratory variability, suggesting right atrial pressure of 3 mmHg.   Assessment and Plan     #) SVT #) palpitations #) parox AFib S/p AVNRT ablation 11/2022, continued to have palpitation episodes post-ablation that were brief in duration and felt like her prior SVT episodes She had change in palpitations recently after  steroidal spinal injection, which prompted zio monitor that revealed paroxysmal AFib that were asymptomatic. These afib episodes were iso influenza Will update TTE Briefly discussed whether to initiate AAD medications vs AF ablation. Given asymptomatic nature of recent AF, will continue to monitor    #) Hypercoag d/t parox afib CHA2DS2-VASc Score = at least 3 [CHF History: 0, HTN History: 1, Diabetes History: 0, Stroke History: 0, Vascular Disease History: 0, Age Score: 1, Gender Score: 1].  Therefore, the patient's annual risk of stroke is 3.2 %.    Stroke ppx - 5mg  eliquis  BID, appropriately dosed No bleeding concerns We discussed alternative OAC medications including xarelto, pradaxa, and coumadin. Will continue eliquis  at this time      Current medicines are reviewed at length with the patient today.   The patient does not have concerns regarding her medicines.  The following changes were made today:  none  Labs/ tests ordered today include:  Orders Placed This Encounter  Procedures   EKG 12-Lead   ECHOCARDIOGRAM COMPLETE     Disposition: Follow up with Dr. Kennyth or EP APP in 3 months   Signed, Chantal Needle, NP  07/26/24  12:57 PM  Electrophysiology [facility]

## 2024-07-26 ENCOUNTER — Encounter: Payer: Self-pay | Admitting: Cardiology

## 2024-07-26 ENCOUNTER — Ambulatory Visit: Attending: Cardiology | Admitting: Cardiology

## 2024-07-26 VITALS — BP 116/68 | HR 65 | Ht 62.0 in | Wt 157.0 lb

## 2024-07-26 DIAGNOSIS — I471 Supraventricular tachycardia, unspecified: Secondary | ICD-10-CM | POA: Diagnosis present

## 2024-07-26 DIAGNOSIS — D6869 Other thrombophilia: Secondary | ICD-10-CM | POA: Diagnosis not present

## 2024-07-26 DIAGNOSIS — I480 Paroxysmal atrial fibrillation: Secondary | ICD-10-CM | POA: Diagnosis not present

## 2024-07-26 MED ORDER — APIXABAN 5 MG PO TABS: 5.0000 mg | ORAL_TABLET | Freq: Two times a day (BID) | ORAL | 0 refills | Status: DC

## 2024-07-26 NOTE — Patient Instructions (Signed)
 Medication Instructions:  No changes  *If you need a refill on your cardiac medications before your next appointment, please call your pharmacy*  Lab Work: None today If you have labs (blood work) drawn today and your tests are completely normal, you will receive your results only by: MyChart Message (if you have MyChart) OR A paper copy in the mail If you have any lab test that is abnormal or we need to change your treatment, we will call you to review the results.  Testing/Procedures: Echo  Follow-Up: At [facility], you and your health needs are our priority.  As part of our continuing mission to provide you with exceptional heart care, our providers are all part of one team.  This team includes your primary Cardiologist (physician) and Advanced Practice Providers or APPs (Physician Assistants and Nurse Practitioners) who all work together to provide you with the care you need, when you need it.  Your next appointment:   6 month(s)  Provider:   Fonda Kitty, MD or Hira Trent, NP    We recommend signing up for the patient portal called MyChart.  Sign up information is provided on this After Visit Summary.  MyChart is used to connect with patients for Virtual Visits (Telemedicine).  Patients are able to view lab/test results, encounter notes, upcoming appointments, etc.  Non-urgent messages can be sent to your provider as well.   To learn more about what you can do with MyChart, go to [email or web address].   Other Instructions  Blood thinner options -  Eliquis  - not generic, twice a day 5mg  Xarelto - not generic, once a day 20mg   Pradax - generic

## 2024-08-03 ENCOUNTER — Ambulatory Visit: Admitting: Cardiology

## 2024-08-14 ENCOUNTER — Telehealth: Payer: Self-pay

## 2024-08-14 NOTE — Telephone Encounter (Signed)
 Copied from CRM #[id number]. Topic: Appointments - Scheduling Inquiry for Clinic >> Aug 14, 2024 10:13 AM Zy'onna H wrote: Reason for CRM: Patient husband called in stating that they currently live an approximate -  2hr drive from the [facility]   Due to this he wanted to know since her Telephone visit is at: 8:10 am, he is currently unsure if they will be able to accommodate this timeframe, but do not want to miss the 11:30 am visit as it has been difficult for them to get a visit with [facility].    - wanted to know if they could come in early for the 11:30 visit - she has a AWV @8 :10 am prior to the 11:30 appointment - if we can possibly come up with a plan where she can keep both -that'd be ideal.   **PCP/PCP Team Please Advise**

## 2024-08-21 ENCOUNTER — Encounter: Payer: Self-pay | Admitting: Internal Medicine

## 2024-08-21 ENCOUNTER — Ambulatory Visit: Admitting: *Deleted

## 2024-08-21 ENCOUNTER — Ambulatory Visit: Payer: Medicare Other | Admitting: Internal Medicine

## 2024-08-21 VITALS — BP 122/74 | HR 62 | Temp 97.8°F | Ht 62.0 in | Wt 161.6 lb

## 2024-08-21 VITALS — Ht 62.0 in | Wt 157.0 lb

## 2024-08-21 DIAGNOSIS — Z23 Encounter for immunization: Secondary | ICD-10-CM | POA: Diagnosis not present

## 2024-08-21 DIAGNOSIS — E782 Mixed hyperlipidemia: Secondary | ICD-10-CM

## 2024-08-21 DIAGNOSIS — Z7901 Long term (current) use of anticoagulants: Secondary | ICD-10-CM

## 2024-08-21 DIAGNOSIS — I482 Chronic atrial fibrillation, unspecified: Secondary | ICD-10-CM

## 2024-08-21 DIAGNOSIS — R7303 Prediabetes: Secondary | ICD-10-CM

## 2024-08-21 DIAGNOSIS — E559 Vitamin D deficiency, unspecified: Secondary | ICD-10-CM

## 2024-08-21 DIAGNOSIS — L2989 Other pruritus: Secondary | ICD-10-CM | POA: Diagnosis not present

## 2024-08-21 DIAGNOSIS — Z0000 Encounter for general adult medical examination without abnormal findings: Secondary | ICD-10-CM | POA: Diagnosis not present

## 2024-08-21 DIAGNOSIS — R21 Rash and other nonspecific skin eruption: Secondary | ICD-10-CM | POA: Diagnosis not present

## 2024-08-21 DIAGNOSIS — Z0001 Encounter for general adult medical examination with abnormal findings: Secondary | ICD-10-CM

## 2024-08-21 DIAGNOSIS — Z1231 Encounter for screening mammogram for malignant neoplasm of breast: Secondary | ICD-10-CM

## 2024-08-21 LAB — COMPREHENSIVE METABOLIC PANEL WITH GFR
ALT: 18 U/L (ref 3–35)
AST: 17 U/L (ref 5–37)
Albumin: 4.5 g/dL (ref 3.5–5.2)
Alkaline Phosphatase: 66 U/L (ref 39–117)
BUN: 17 mg/dL (ref 6–23)
CO2: 28 meq/L (ref 19–32)
Calcium: 9.4 mg/dL (ref 8.4–10.5)
Chloride: 104 meq/L (ref 96–112)
Creatinine, Ser: 0.65 mg/dL (ref 0.40–1.20)
GFR: 90.75 mL/min
Glucose, Bld: 108 mg/dL — ABNORMAL HIGH (ref 70–99)
Potassium: 4.2 meq/L (ref 3.5–5.1)
Sodium: 141 meq/L (ref 135–145)
Total Bilirubin: 0.4 mg/dL (ref 0.2–1.2)
Total Protein: 7 g/dL (ref 6.0–8.3)

## 2024-08-21 LAB — CBC WITH DIFFERENTIAL/PLATELET
Basophils Absolute: 0 K/uL (ref 0.0–0.1)
Basophils Relative: 0.4 % (ref 0.0–3.0)
Eosinophils Absolute: 0.1 K/uL (ref 0.0–0.7)
Eosinophils Relative: 1.1 % (ref 0.0–5.0)
HCT: 40.7 % (ref 36.0–46.0)
Hemoglobin: 13.3 g/dL (ref 12.0–15.0)
Lymphocytes Relative: 42.8 % (ref 12.0–46.0)
Lymphs Abs: 2.7 K/uL (ref 0.7–4.0)
MCHC: 32.8 g/dL (ref 30.0–36.0)
MCV: 90.9 fl (ref 78.0–100.0)
Monocytes Absolute: 0.3 K/uL (ref 0.1–1.0)
Monocytes Relative: 5.2 % (ref 3.0–12.0)
Neutro Abs: 3.2 K/uL (ref 1.4–7.7)
Neutrophils Relative %: 50.5 % (ref 43.0–77.0)
Platelets: 268 K/uL (ref 150.0–400.0)
RBC: 4.48 Mil/uL (ref 3.87–5.11)
RDW: 12.2 % (ref 11.5–15.5)
WBC: 6.3 K/uL (ref 4.0–10.5)

## 2024-08-21 LAB — LIPID PANEL
Cholesterol: 261 mg/dL — ABNORMAL HIGH (ref 28–200)
HDL: 58.4 mg/dL
LDL Cholesterol: 166 mg/dL — ABNORMAL HIGH (ref 10–99)
NonHDL: 202.31
Total CHOL/HDL Ratio: 4
Triglycerides: 180 mg/dL — ABNORMAL HIGH (ref 10.0–149.0)
VLDL: 36 mg/dL (ref 0.0–40.0)

## 2024-08-21 LAB — VITAMIN D 25 HYDROXY (VIT D DEFICIENCY, FRACTURES): VITD: 20.89 ng/mL — ABNORMAL LOW (ref 30.00–100.00)

## 2024-08-21 LAB — HEMOGLOBIN A1C: Hgb A1c MFr Bld: 5.9 % (ref 4.6–6.5)

## 2024-08-21 MED ORDER — CALCIPOTRIENE 0.005 % EX SOLN: CUTANEOUS | 0 refills | Status: AC

## 2024-08-21 MED ORDER — NYSTATIN 100000 UNIT/GM EX CREA: 1.0000 | TOPICAL_CREAM | Freq: Two times a day (BID) | CUTANEOUS | 0 refills | Status: AC

## 2024-08-21 MED ORDER — TRIAMCINOLONE ACETONIDE 0.5 % EX OINT: 1.0000 | TOPICAL_OINTMENT | Freq: Two times a day (BID) | CUTANEOUS | 0 refills | Status: AC

## 2024-08-21 NOTE — Patient Instructions (Addendum)
 I am treating you for a candidal infection (yeast)  Start taking fexofenadine 180 mg (allegra) twice daily  for the itching   Use the triamcinolone cream AND THE NYSTATIN CREAM  twice daily   If rash  resolves,  stop both creams and start using [employer] medicated powder with zinc daily .

## 2024-08-21 NOTE — Progress Notes (Unsigned)
 Patient ID: Anne Ingram, female    DOB: 04-29-1956  Age: 69 y.o. MRN: [id number]  The patient is here for  FOLLOW UP AND management of other chronic and acute problems.   The risk factors are reflected in the social history.  The roster of all physicians providing medical care to patient - is listed in the Snapshot section of the chart.  Activities of daily living:  The patient is 100% independent in all ADLs: dressing, toileting, feeding as well as independent mobility  Home safety : The patient has smoke detectors in the home. They wear seatbelts.  There are no firearms at home. There is no violence in the home.   There is no risks for hepatitis, STDs or HIV. There is no   history of blood transfusion. They have no travel history to infectious disease endemic areas of the world.  The patient has seen their dentist in the last six month. They have seen their eye doctor in the last year. They admit to slight hearing difficulty with regard to whispered voices and some television programs.  They have deferred audiologic testing in the last year.  They do not  have excessive sun exposure. Discussed the need for sun protection: hats, long sleeves and use of sunscreen if there is significant sun exposure.   Diet: the importance of a healthy diet is discussed. They do have a healthy diet.  The benefits of regular aerobic exercise were discussed. She walks 4 times per week ,  20 minutes.   Depression screen: there are no signs or vegative symptoms of depression- irritability, change in appetite, anhedonia, sadness/tearfullness.  Cognitive assessment: the patient manages all their financial and personal affairs and is actively engaged. They could relate day,date,year and events; recalled 2/3 objects at 3 minutes; performed clock-face test normally.  The following portions of the patient's history were reviewed and updated as appropriate: allergies, current medications, past family history, past  medical history,  past surgical history, past social history  and problem list.  Visual acuity was not assessed per patient preference since she has regular follow up with her ophthalmologist. Hearing and body mass index were assessed and reviewed.   During the course of the visit the patient was educated and counseled about appropriate screening and preventive services including : fall prevention , diabetes screening, nutrition counseling, colorectal cancer screening, and recommended immunizations.    CC: There were no encounter diagnoses.   1) diagnosed with atrial fibrillation /atrial flutter during routine follow up on SVT , now on Eliquis   2) diagnosed with influenza A  during return from [place]  in mid October in [place] , NEEDS FLU VACCINE  3) treated for sciatica with ESI in August , left sided .  Pain improved with one ESI by [facility] in [place], and completed PT x 3 months   4) RASH ITCHY  ON CHEST.  RECURRENT DURING SUMMER , FIRST EPISODE OF WINTER,  OCCURRED WHILE IN [place]   TREATED WITH ZEASORB POWDER OVER  ONE YEAR AGO BY [facility], WHICH HAS NOT HELPED ZEASORB History Anne Ingram has a past medical history of Allergy (Baby), Cardiac arrhythmia due to congenital heart disease, Chicken pox, Elevated blood pressure reading, and Hypertension (03/2020).   She has a past surgical history that includes SVT ABLATION (N/A, 12/21/2022); Tubal ligation (1995); and Cesarean section [phone]).   Her family history includes Hypertension in her mother; Mental illness in her mother.She reports that she has quit smoking. She has never  used smokeless tobacco. She reports current alcohol use of about 5.0 standard drinks of alcohol per week. She reports that she does not use drugs.  Outpatient Medications Prior to Visit  Medication Sig Dispense Refill   apixaban  (ELIQUIS ) 5 MG TABS tablet Take 1 tablet (5 mg total) by mouth 2 (two) times daily. 180 tablet 3   Calcium Carbonate-Vit D-Min  (CALCIUM 600+D PLUS MINERALS PO) Take 1 tablet by mouth 2 (two) times a week.     escitalopram  (LEXAPRO ) 5 MG tablet TAKE 1 TABLET BY MOUTH DAILY 30 tablet 1   estradiol  (ESTRACE ) 0.1 MG/GM vaginal cream INSERT ONE APPLICATORFUL VAGINALLY 2 TIMES A WEEK 127.5 g 0   metoprolol  succinate (TOPROL -XL) 50 MG 24 hr tablet TAKE 1 TABLET BY MOUTH DAILY WITH A MEAL OR IMMEDIATELY FOLLOWING A MEAL 90 tablet 2   acetaminophen  (TYLENOL ) 500 MG tablet Take 1,000 mg by mouth every 6 (six) hours. (Patient not taking: Reported on 08/21/2024)     calcipotriene  (DOVONOX) 0.005 % cream Apply topically 2 (two) times daily. To  psoriatic rash on scalp (Patient not taking: Reported on 08/21/2024) 60 g 2   metoprolol  tartrate (LOPRESSOR ) 25 MG tablet TAKE A HALF TABLET BY MOUTH 2 TIMES A DAY AS NEEDED (Patient not taking: Reported on 08/21/2024) 90 tablet 3   No facility-administered medications prior to visit.    Review of Systems  Objective:  BP 122/74   Pulse 62   Temp 97.8 F (36.6 C)   Ht 5' 2 (1.575 m)   Wt 161 lb 9.6 oz (73.3 kg)   SpO2 97%   BMI 29.56 kg/m   Physical Exam  Assessment & Plan:  There are no diagnoses linked to this encounter.    I provided 40 minutes of  face-to-face time during this encounter reviewing patient's current problems and past surgeries,  recent labs and imaging studies, providing counseling on the above mentioned problems , and coordination  of care .   Follow-up: No follow-ups on file.   Verneita LITTIE Kettering, MD

## 2024-08-21 NOTE — Patient Instructions (Signed)
 Anne Ingram,  Thank you for taking the time for your Medicare Wellness Visit. I appreciate your continued commitment to your health goals. Please review the care plan we discussed, and feel free to reach out if I can assist you further.  Please note that Annual Wellness Visits do not include a physical exam. Some assessments may be limited, especially if the visit was conducted virtually. If needed, we may recommend an in-person follow-up with your provider.  Ongoing Care Seeing your primary care provider every 3 to 6 months helps us  monitor your health and provide consistent, personalized care.  Remember to update your flu and pneumonia vaccines and consider covid vaccine. You have an order for:  []   2D Mammogram  [x]   3D Mammogram  []   Bone Density     Please call for appointment:  [facility]  [address] [place] [address] [phone]   Make sure to wear two-piece clothing.  No lotions, powders, or deodorants the day of the appointment. Make sure to bring picture ID and insurance card.  Bring list of medications you are currently taking including any supplements.    Referrals If a referral was made during today's visit and you haven't received any updates within two weeks, please contact the referred provider directly to check on the status.  Recommended Screenings:  Health Maintenance  Topic Date Due   Pneumococcal Vaccine for age over 36 (1 of 1 - PCV) Never done   COVID-19 Vaccine (3 - Moderna risk series) 01/01/2020   Pap Smear  08/11/2023   Breast Cancer Screening  06/28/2024   Flu Shot  11/21/2024*   Medicare Annual Wellness Visit  08/21/2025   DTaP/Tdap/Td vaccine (3 - Td or Tdap) 07/27/2033   Colon Cancer Screening  10/26/2033   Osteoporosis screening with Bone Density Scan  Completed   Hepatitis C Screening  Completed   Zoster (Shingles) Vaccine  Completed   Meningitis B Vaccine  Aged Out  *Topic was  postponed. The date shown is not the original due date.       08/21/2024    8:08 AM  Advanced Directives  Does Patient Have a Medical Advance Directive? Yes  Type of Estate Agent of [place];Living will  Does patient want to make changes to medical advance directive? No - Patient declined  Copy of Healthcare Power of Attorney in Chart? No - copy requested    Vision: Annual vision screenings are recommended for early detection of glaucoma, cataracts, and diabetic retinopathy. These exams can also reveal signs of chronic conditions such as diabetes and high blood pressure.  Dental: Annual dental screenings help detect early signs of oral cancer, gum disease, and other conditions linked to overall health, including heart disease and diabetes.  Please see the attached documents for additional preventive care recommendations.

## 2024-08-21 NOTE — Progress Notes (Signed)
 "  Chief Complaint  Patient presents with   Medicare Wellness     Subjective:   Anne Ingram is a 68 y.o. female who presents for a [employer] Visit.  Visit info / Clinical Intake: Medicare Wellness Visit Type:: Initial Annual Wellness Visit Persons participating in visit and providing information:: patient Medicare Wellness Visit Mode:: Telephone If telephone:: video declined Since this visit was completed virtually, some vitals may be partially provided or unavailable. Missing vitals are due to the limitations of the virtual format.: Unable to obtain vitals - no equipment If Telephone or Video please confirm:: I connected with patient using audio/video enable telemedicine. I verified patient identity with two identifiers, discussed telehealth limitations, and patient agreed to proceed. Patient Location:: Home Provider Location:: Office/Home Interpreter Needed?: No Pre-visit prep was completed: yes AWV questionnaire completed by patient prior to visit?: no Living arrangements:: lives with spouse/significant other Patient's Overall Health Status Rating: very good Typical amount of pain: none Does pain affect daily life?: no Are you currently prescribed opioids?: no  Dietary Habits and Nutritional Risks How many meals a day?: 3 Eats fruit and vegetables daily?: yes Most meals are obtained by: preparing own meals In the last 2 weeks, have you had any of the following?: none Diabetic:: no  Functional Status Activities of Daily Living (to include ambulation/medication): Independent Ambulation: Independent Medication Administration: Independent Home Management (perform basic housework or laundry): Independent Manage your own finances?: yes Primary transportation is: driving Concerns about vision?: no *vision screening is required for [facility]* Concerns about hearing?: no  Fall Screening Falls in the past year?: 0 Number of falls in past year: 0 Was there an injury  with Fall?: 0 Fall Risk Category Calculator: 0 Patient Fall Risk Level: Low Fall Risk  Fall Risk Patient at Risk for Falls Due to: No Fall Risks Fall risk Follow up: Falls evaluation completed; Falls prevention discussed  Home and Transportation Safety: All rugs have non-skid backing?: yes All stairs or steps have railings?: yes Grab bars in the bathtub or shower?: (!) no Have non-skid surface in bathtub or shower?: yes Good home lighting?: yes Regular seat belt use?: yes Hospital stays in the last year:: no  Cognitive Assessment Difficulty concentrating, remembering, or making decisions? : no Will 6CIT or Mini Cog be Completed: yes What year is it?: 0 points What month is it?: 0 points Give patient an address phrase to remember (5 components): [address] [place] About what time is it?: 0 points Count backwards from 20 to 1: 0 points Say the months of the year in reverse: 0 points Repeat the address phrase from earlier: 0 points 6 CIT Score: 0 points  Advance Directives (For Healthcare) Does Patient Have a Medical Advance Directive?: Yes Does patient want to make changes to medical advance directive?: No - Patient declined Type of Advance Directive: Healthcare Power of [place]; Living will Copy of Healthcare Power of Attorney in Chart?: No - copy requested Copy of Living Will in Chart?: No - copy requested  Reviewed/Updated  Reviewed/Updated: Reviewed All (Medical, Surgical, Family, Medications, Allergies, Care Teams, Patient Goals)    Allergies (verified) Latex and Penicillins   Current Medications (verified) Outpatient Encounter Medications as of 08/21/2024  Medication Sig   acetaminophen  (TYLENOL ) 500 MG tablet Take 1,000 mg by mouth every 6 (six) hours. (Patient taking differently: Take by mouth as needed.)   apixaban  (ELIQUIS ) 5 MG TABS tablet Take 1 tablet (5 mg total) by mouth 2 (two) times  daily.   calcipotriene  (DOVONOX) 0.005 % cream Apply  topically 2 (two) times daily. To  psoriatic rash on scalp   Calcium Carbonate-Vit D-Min (CALCIUM 600+D PLUS MINERALS PO) Take 1 tablet by mouth 2 (two) times a week.   escitalopram  (LEXAPRO ) 5 MG tablet TAKE 1 TABLET BY MOUTH DAILY   estradiol  (ESTRACE ) 0.1 MG/GM vaginal cream INSERT ONE APPLICATORFUL VAGINALLY 2 TIMES A WEEK   metoprolol  tartrate (LOPRESSOR ) 25 MG tablet TAKE A HALF TABLET BY MOUTH 2 TIMES A DAY AS NEEDED (Patient taking differently: Take 25 mg by mouth as needed. TAKE A HALF TABLET BY MOUTH 2 TIMES A DAY AS NEEDED)   [DISCONTINUED] apixaban  (ELIQUIS ) 5 MG TABS tablet Take 1 tablet (5 mg total) by mouth 2 (two) times daily.   metoprolol  succinate (TOPROL -XL) 50 MG 24 hr tablet TAKE 1 TABLET BY MOUTH DAILY WITH A MEAL OR IMMEDIATELY FOLLOWING A MEAL   No facility-administered encounter medications on file as of 08/21/2024.    History: Past Medical History:  Diagnosis Date   Allergy Baby   Alkergic to penicillin   Cardiac arrhythmia due to congenital heart disease    Chicken pox    Elevated blood pressure reading    Hypertension 03/2020   Past Surgical History:  Procedure Laterality Date   CESAREAN SECTION  [phone]   SVT ABLATION N/A 12/21/2022   Procedure: SVT ABLATION;  Surgeon: Cindie Ole DASEN, MD;  Location: [facility] INVASIVE CV LAB;  Service: Cardiovascular;  Laterality: N/A;   TUBAL LIGATION  1995   Family History  Problem Relation Age of Onset   Hypertension Mother    Mental illness Mother    Cancer Neg Hx    Breast cancer Neg Hx    Social History   Occupational History   Not on file  Tobacco Use   Smoking status: Former   Smokeless tobacco: Never   Tobacco comments:    Quit as a teenager  Substance and Sexual Activity   Alcohol use: Yes    Alcohol/week: 5.0 standard drinks of alcohol    Types: 5 Standard drinks or equivalent per week    Comment: rarely   Drug use: No   Sexual activity: Not on file   Tobacco Counseling Counseling given:  Not Answered Tobacco comments: Quit as a teenager  SDOH Screenings   Food Insecurity: No Food Insecurity (08/21/2024)  Housing: Low Risk (08/21/2024)  Transportation Needs: No Transportation Needs (08/21/2024)  Utilities: Not At Risk (08/21/2024)  Alcohol Screen: Low Risk (08/21/2024)  Depression (PHQ2-9): Low Risk (08/21/2024)  Financial Resource Strain: Low Risk (08/21/2024)  Physical Activity: Sufficiently Active (08/21/2024)  Social Connections: Moderately Integrated (08/21/2024)  Stress: No Stress Concern Present (08/21/2024)  Tobacco Use: Medium Risk (08/21/2024)  Health Literacy: Adequate Health Literacy (08/21/2024)   See flowsheets for full screening details  Depression Screen PHQ 2 & 9 Depression Scale- Over the past 2 weeks, how often have you been bothered by any of the following problems? Little interest or pleasure in doing things: 0 Feeling down, depressed, or hopeless (PHQ Adolescent also includes...irritable): 0 PHQ-2 Total Score: 0 Trouble falling or staying asleep, or sleeping too much: 0 Feeling tired or having little energy: 0 Poor appetite or overeating (PHQ Adolescent also includes...weight loss): 0 Feeling bad about yourself - or that you are a failure or have let yourself or your family down: 0 Trouble concentrating on things, such as reading the newspaper or watching television (PHQ Adolescent also includes...like school work): 0 Moving or  speaking so slowly that other people could have noticed. Or the opposite - being so fidgety or restless that you have been moving around a lot more than usual: 0 Thoughts that you would be better off dead, or of hurting yourself in some way: 0 PHQ-9 Total Score: 0 If you checked off any problems, how difficult have these problems made it for you to do your work, take care of things at home, or get along with other people?: Not difficult at all     Goals Addressed             This Visit's Progress    Patient  Stated       Wants to walk more              Objective:    Today's Vitals   08/21/24 0803  Weight: 157 lb (71.2 kg)  Height: 5' 2 (1.575 m)   Body mass index is 28.72 kg/m.  Hearing/Vision screen Hearing Screening - Comments:: No issues Vision Screening - Comments:: Glasses, [employer], up to date Immunizations and Health Maintenance Health Maintenance  Topic Date Due   Pneumococcal Vaccine: 50+ Years (1 of 1 - PCV) Never done   COVID-19 Vaccine (3 - Moderna risk series) 01/01/2020   Cervical Cancer Screening (Pap smear)  08/11/2023   Mammogram  06/28/2024   Influenza Vaccine  11/21/2024 (Originally 03/24/2024)   Medicare Annual Wellness (AWV)  08/21/2025   DTaP/Tdap/Td (3 - Td or Tdap) 07/27/2033   Colonoscopy  10/26/2033   Bone Density Scan  Completed   Hepatitis C Screening  Completed   Zoster Vaccines- Shingrix  Completed   Meningococcal B Vaccine  Aged Out        Assessment/Plan:  This is a routine wellness examination for [place].  Patient Care Team: Marylynn Verneita CROME, MD as PCP - General (Internal Medicine) Cindie Ole DASEN, MD as PCP - Electrophysiology (Clinical Cardiac Electrophysiology) Riddle, Suzann, NP as Nurse Practitioner (Clinical Cardiac Electrophysiology)  I have personally reviewed and noted the following in the patients chart:   Medical and social history Use of alcohol, tobacco or illicit drugs  Current medications and supplements including opioid prescriptions. Functional ability and status Nutritional status Physical activity Advanced directives List of other physicians Hospitalizations, surgeries, and ER visits in previous 12 months Vitals Screenings to include cognitive, depression, and falls Referrals and appointments  Orders Placed This Encounter  Procedures   MM 3D SCREENING MAMMOGRAM BILATERAL BREAST    Standing Status:   Future    Expiration Date:   08/21/2025    Reason for Exam (SYMPTOM  OR DIAGNOSIS REQUIRED):   need  for cancer screening    Preferred [facility]   In addition, I have reviewed and discussed with patient certain preventive protocols, quality metrics, and best practice recommendations. A written personalized care plan for preventive services as well as general preventive health recommendations were provided to patient.   Angeline Fredericks, LPN   [id number]/[id number]   Return in 1 year (on 08/21/2025).  After Visit Summary: (MyChart) Due to this being a telephonic visit, the after visit summary with patients personalized plan was offered to patient via MyChart   Nurse Notes:Order placed for mammogram.  Discussed the need to update flu and pneumonia vaccines. Patient stated she will consider the covid vaccine.  "

## 2024-08-22 ENCOUNTER — Ambulatory Visit: Payer: Self-pay | Admitting: Internal Medicine

## 2024-08-22 DIAGNOSIS — E782 Mixed hyperlipidemia: Secondary | ICD-10-CM

## 2024-08-22 DIAGNOSIS — R21 Rash and other nonspecific skin eruption: Secondary | ICD-10-CM | POA: Insufficient documentation

## 2024-08-22 DIAGNOSIS — E559 Vitamin D deficiency, unspecified: Secondary | ICD-10-CM

## 2024-08-22 LAB — ANA W/REFLEX IF POSITIVE: Anti Nuclear Antibody (ANA): NEGATIVE

## 2024-08-22 MED ORDER — ERGOCALCIFEROL 1.25 MG (50000 UT) PO CAPS: 50000.0000 [IU] | ORAL_CAPSULE | ORAL | 0 refills | Status: AC

## 2024-08-22 NOTE — Assessment & Plan Note (Signed)
 Location and appearance suggests candida. Nystatin/triamcinone creams prescribed,  antihistamine for itching

## 2024-08-22 NOTE — Assessment & Plan Note (Addendum)
 Diagnosis of SVT changed to atrial fib/flutter confirmed by ZIO.  She is tolerating Eliquis   for embolic stroke risk mitigation due to  atrial fibrillation. Patient has no signs of bleeding and is advised to notify her specialists prior to any procedure that may required suspension of Eliquis 

## 2024-08-22 NOTE — Assessment & Plan Note (Signed)
 age appropriate education and counseling updated, referrals for preventative services and immunizations addressed, dietary and smoking counseling addressed, most recent labs reviewed.  I have personally reviewed and have noted:   1) the patient's medical and social history 2) The pt's use of alcohol, tobacco, and illicit drugs 3) The patient's current medications and supplements 4) Functional ability including ADL's, fall risk, home safety risk, hearing and visual impairment 5) Diet and physical activities 6) Evidence for depression or mood disorder    I have made referrals, and provided counseling and education based on review of the above

## 2024-08-29 ENCOUNTER — Ambulatory Visit: Attending: Cardiology

## 2024-08-29 ENCOUNTER — Ambulatory Visit: Payer: Self-pay | Admitting: Cardiology

## 2024-08-29 DIAGNOSIS — I471 Supraventricular tachycardia, unspecified: Secondary | ICD-10-CM | POA: Insufficient documentation

## 2024-08-29 DIAGNOSIS — I480 Paroxysmal atrial fibrillation: Secondary | ICD-10-CM | POA: Diagnosis not present

## 2024-08-29 LAB — ECHOCARDIOGRAM COMPLETE
AR max vel: 1.96 cm2
AV Area VTI: 1.94 cm2
AV Area mean vel: 1.86 cm2
AV Mean grad: 4 mmHg
AV Peak grad: 6.8 mmHg
Ao pk vel: 1.3 m/s
Area-P 1/2: 3.72 cm2
Calc EF: 57.6 %
S' Lateral: 2.9 cm
Single Plane A2C EF: 52.3 %
Single Plane A4C EF: 59 %

## 2024-09-04 ENCOUNTER — Telehealth: Payer: Self-pay | Admitting: Internal Medicine

## 2024-09-04 NOTE — Telephone Encounter (Signed)
 I have placed in red folder for review

## 2024-09-04 NOTE — Telephone Encounter (Signed)
 Pt husband dropped off a letter for Dr. Tullo and it has been placed in back mailbox.

## 2024-09-05 NOTE — Telephone Encounter (Signed)
 Spoke with pt's husband and he stated that he would like for pt to be seen sooner than her next appt in May but doesn't want pt to know that he suggested this or the reason why. If okay I w0ill call pt and see about getting pt seen sooner.

## 2024-09-20 ENCOUNTER — Telehealth: Payer: Self-pay | Admitting: Internal Medicine

## 2024-09-20 NOTE — Telephone Encounter (Signed)
 See telephone encounter 1/12 for pt concern's

## 2024-09-20 NOTE — Telephone Encounter (Signed)
 Pt's spouse has stopped in, has concerns about upcoming appointment and would like to discuss with Dr Marylynn beforehand. He would like to know what his role should be at time of appt. Please reach out to Husband [name] at [phone].

## 2024-09-24 ENCOUNTER — Telehealth: Payer: Self-pay

## 2024-09-24 NOTE — Telephone Encounter (Signed)
 I left voicemail for patient asking her to please call us  back to reschedule her 09/25/2024 appointment with Dr. Verneita Kettering, as we will be closed due to inclement weather.  I also sent a message to patient via MyChart.  E2C2 - when patient calls back, please assist her with rescheduling her appointment.

## 2024-09-25 ENCOUNTER — Ambulatory Visit: Admitting: Internal Medicine

## 2025-01-22 ENCOUNTER — Ambulatory Visit: Admitting: Cardiology

## 2025-02-19 ENCOUNTER — Ambulatory Visit: Admitting: Internal Medicine

## 2025-08-29 ENCOUNTER — Ambulatory Visit
